# Patient Record
Sex: Female | Born: 1975 | Race: White | Hispanic: No | Smoking: Former smoker
Health system: Southern US, Community
[De-identification: ages and names within clinical notes are randomized; demographics above are authoritative.]

## PROBLEM LIST (undated history)

## (undated) DIAGNOSIS — IMO0002 Reserved for concepts with insufficient information to code with codable children: Secondary | ICD-10-CM

## (undated) DIAGNOSIS — F32A Depression, unspecified: Secondary | ICD-10-CM

## (undated) DIAGNOSIS — M1990 Unspecified osteoarthritis, unspecified site: Secondary | ICD-10-CM

## (undated) DIAGNOSIS — F419 Anxiety disorder, unspecified: Secondary | ICD-10-CM

## (undated) DIAGNOSIS — F319 Bipolar disorder, unspecified: Secondary | ICD-10-CM

## (undated) DIAGNOSIS — E785 Hyperlipidemia, unspecified: Secondary | ICD-10-CM

## (undated) DIAGNOSIS — J45909 Unspecified asthma, uncomplicated: Secondary | ICD-10-CM

## (undated) DIAGNOSIS — G43909 Migraine, unspecified, not intractable, without status migrainosus: Secondary | ICD-10-CM

## (undated) DIAGNOSIS — M797 Fibromyalgia: Secondary | ICD-10-CM

## (undated) DIAGNOSIS — G629 Polyneuropathy, unspecified: Secondary | ICD-10-CM

## (undated) DIAGNOSIS — K219 Gastro-esophageal reflux disease without esophagitis: Secondary | ICD-10-CM

## (undated) DIAGNOSIS — F329 Major depressive disorder, single episode, unspecified: Secondary | ICD-10-CM

## (undated) DIAGNOSIS — J302 Other seasonal allergic rhinitis: Secondary | ICD-10-CM

## (undated) HISTORY — PX: ABDOMINAL HYSTERECTOMY: SHX81

## (undated) HISTORY — PX: TUBAL LIGATION: SHX77

## (undated) HISTORY — PX: CARPAL TUNNEL RELEASE: SHX101

## (undated) HISTORY — PX: BACK SURGERY: SHX140

## (undated) HISTORY — PX: LEG SURGERY: SHX1003

## (undated) HISTORY — DX: Hyperlipidemia, unspecified: E78.5

## (undated) HISTORY — PX: OTHER SURGICAL HISTORY: SHX169

---
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
## 1997-09-04 HISTORY — PX: TUBAL LIGATION: SHX77

## 1998-08-25 ENCOUNTER — Emergency Department (HOSPITAL_COMMUNITY): Admission: EM | Admit: 1998-08-25 | Discharge: 1998-08-25 | Payer: Self-pay | Admitting: Emergency Medicine

## 1999-02-10 ENCOUNTER — Emergency Department (HOSPITAL_COMMUNITY): Admission: EM | Admit: 1999-02-10 | Discharge: 1999-02-10 | Payer: Self-pay

## 2001-07-10 ENCOUNTER — Emergency Department (HOSPITAL_COMMUNITY): Admission: EM | Admit: 2001-07-10 | Discharge: 2001-07-10 | Payer: Self-pay | Admitting: Emergency Medicine

## 2002-02-21 ENCOUNTER — Emergency Department (HOSPITAL_COMMUNITY): Admission: EM | Admit: 2002-02-21 | Discharge: 2002-02-21 | Payer: Self-pay | Admitting: Emergency Medicine

## 2002-06-03 ENCOUNTER — Emergency Department (HOSPITAL_COMMUNITY): Admission: EM | Admit: 2002-06-03 | Discharge: 2002-06-03 | Payer: Self-pay | Admitting: Emergency Medicine

## 2002-06-13 ENCOUNTER — Emergency Department (HOSPITAL_COMMUNITY): Admission: EM | Admit: 2002-06-13 | Discharge: 2002-06-13 | Payer: Self-pay | Admitting: Emergency Medicine

## 2002-06-24 ENCOUNTER — Emergency Department (HOSPITAL_COMMUNITY): Admission: EM | Admit: 2002-06-24 | Discharge: 2002-06-24 | Payer: Self-pay | Admitting: Emergency Medicine

## 2002-07-29 ENCOUNTER — Emergency Department (HOSPITAL_COMMUNITY): Admission: EM | Admit: 2002-07-29 | Discharge: 2002-07-30 | Payer: Self-pay | Admitting: Emergency Medicine

## 2003-01-19 ENCOUNTER — Emergency Department (HOSPITAL_COMMUNITY): Admission: EM | Admit: 2003-01-19 | Discharge: 2003-01-19 | Payer: Self-pay | Admitting: Emergency Medicine

## 2003-01-19 ENCOUNTER — Encounter: Payer: Self-pay | Admitting: Emergency Medicine

## 2003-04-01 ENCOUNTER — Emergency Department (HOSPITAL_COMMUNITY): Admission: EM | Admit: 2003-04-01 | Discharge: 2003-04-01 | Payer: Self-pay | Admitting: Emergency Medicine

## 2003-04-01 ENCOUNTER — Encounter: Payer: Self-pay | Admitting: Emergency Medicine

## 2005-12-12 ENCOUNTER — Emergency Department (HOSPITAL_COMMUNITY): Admission: EM | Admit: 2005-12-12 | Discharge: 2005-12-12 | Payer: Self-pay | Admitting: Emergency Medicine

## 2006-05-31 ENCOUNTER — Ambulatory Visit (HOSPITAL_COMMUNITY): Admission: RE | Admit: 2006-05-31 | Discharge: 2006-05-31 | Payer: Self-pay | Admitting: Obstetrics and Gynecology

## 2007-09-05 HISTORY — PX: BACK SURGERY: SHX140

## 2007-10-18 ENCOUNTER — Encounter: Admission: RE | Admit: 2007-10-18 | Discharge: 2007-10-18 | Payer: Self-pay | Admitting: Orthopedic Surgery

## 2007-10-26 ENCOUNTER — Emergency Department (HOSPITAL_COMMUNITY): Admission: EM | Admit: 2007-10-26 | Discharge: 2007-10-26 | Payer: Self-pay | Admitting: Emergency Medicine

## 2007-11-27 ENCOUNTER — Ambulatory Visit: Payer: Self-pay | Admitting: *Deleted

## 2007-11-27 ENCOUNTER — Inpatient Hospital Stay (HOSPITAL_COMMUNITY): Admission: RE | Admit: 2007-11-27 | Discharge: 2007-11-30 | Payer: Self-pay | Admitting: Orthopedic Surgery

## 2007-11-28 ENCOUNTER — Encounter (INDEPENDENT_AMBULATORY_CARE_PROVIDER_SITE_OTHER): Payer: Self-pay | Admitting: Orthopedic Surgery

## 2008-01-21 ENCOUNTER — Encounter: Admission: RE | Admit: 2008-01-21 | Discharge: 2008-03-09 | Payer: Self-pay | Admitting: Orthopedic Surgery

## 2008-06-16 ENCOUNTER — Emergency Department (HOSPITAL_COMMUNITY): Admission: EM | Admit: 2008-06-16 | Discharge: 2008-06-16 | Payer: Self-pay | Admitting: Emergency Medicine

## 2008-09-04 HISTORY — PX: ABDOMINAL HYSTERECTOMY: SHX81

## 2009-02-24 ENCOUNTER — Emergency Department (HOSPITAL_COMMUNITY): Admission: EM | Admit: 2009-02-24 | Discharge: 2009-02-24 | Payer: Self-pay | Admitting: Emergency Medicine

## 2009-07-22 ENCOUNTER — Ambulatory Visit: Payer: Self-pay | Admitting: Radiology

## 2009-07-22 ENCOUNTER — Emergency Department (HOSPITAL_BASED_OUTPATIENT_CLINIC_OR_DEPARTMENT_OTHER): Admission: EM | Admit: 2009-07-22 | Discharge: 2009-07-22 | Payer: Self-pay | Admitting: Emergency Medicine

## 2009-07-25 ENCOUNTER — Emergency Department (HOSPITAL_COMMUNITY): Admission: EM | Admit: 2009-07-25 | Discharge: 2009-07-25 | Payer: Self-pay | Admitting: Emergency Medicine

## 2009-08-09 ENCOUNTER — Encounter
Admission: RE | Admit: 2009-08-09 | Discharge: 2009-08-09 | Payer: Self-pay | Admitting: Physical Medicine and Rehabilitation

## 2009-09-04 HISTORY — PX: PLANTAR FASCIA SURGERY: SHX746

## 2009-09-04 HISTORY — PX: BLADDER SURGERY: SHX569

## 2010-04-03 ENCOUNTER — Emergency Department (HOSPITAL_BASED_OUTPATIENT_CLINIC_OR_DEPARTMENT_OTHER): Admission: EM | Admit: 2010-04-03 | Discharge: 2010-04-03 | Payer: Self-pay | Admitting: Emergency Medicine

## 2010-05-27 ENCOUNTER — Emergency Department (HOSPITAL_COMMUNITY): Admission: EM | Admit: 2010-05-27 | Discharge: 2010-05-27 | Payer: Self-pay | Admitting: Emergency Medicine

## 2010-08-03 ENCOUNTER — Emergency Department (HOSPITAL_COMMUNITY): Admission: EM | Admit: 2010-08-03 | Discharge: 2010-08-03 | Payer: Self-pay | Admitting: Emergency Medicine

## 2010-11-17 LAB — GLUCOSE, CAPILLARY: Glucose-Capillary: 99 mg/dL (ref 70–99)

## 2010-12-07 LAB — BASIC METABOLIC PANEL
CO2: 30 mEq/L (ref 19–32)
Chloride: 102 mEq/L (ref 96–112)
Creatinine, Ser: 0.7 mg/dL (ref 0.4–1.2)
GFR calc Af Amer: >60 mL/min (ref >60)
Potassium: 5.1 mEq/L (ref 3.5–5.1)
Sodium: 144 mEq/L (ref 135–145)

## 2010-12-07 LAB — CBC
Hemoglobin: 14.6 g/dL (ref 12.0–15.0)
MCV: 93.5 fL (ref 78.0–100.0)
RBC: 4.57 MIL/uL (ref 3.87–5.11)
WBC: 9 10*3/uL (ref 4.0–10.5)

## 2010-12-07 LAB — DIFFERENTIAL
Eosinophils Absolute: 0.2 10*3/uL (ref 0.0–0.7)
Lymphs Abs: 3.6 10*3/uL (ref 0.7–4.0)
Monocytes Absolute: 0.5 10*3/uL (ref 0.1–1.0)
Monocytes Relative: 6 % (ref 3–12)
Neutrophils Relative %: 51 % (ref 43–77)

## 2010-12-07 LAB — POCT CARDIAC MARKERS
Myoglobin, poc: 30.2 ng/mL (ref 12–200)
Troponin i, poc: <0.05 ng/mL (ref 0.00–0.09)

## 2011-01-03 ENCOUNTER — Emergency Department (HOSPITAL_COMMUNITY)
Admission: EM | Admit: 2011-01-03 | Discharge: 2011-01-04 | Disposition: A | Discharge status: Home / Self Care | Payer: Medicaid Other | Attending: Emergency Medicine | Admitting: Emergency Medicine

## 2011-01-03 ENCOUNTER — Emergency Department (HOSPITAL_COMMUNITY): Payer: Medicaid Other

## 2011-01-03 DIAGNOSIS — Z90710 Acquired absence of both cervix and uterus: Secondary | ICD-10-CM | POA: Insufficient documentation

## 2011-01-03 DIAGNOSIS — R1031 Right lower quadrant pain: Secondary | ICD-10-CM | POA: Insufficient documentation

## 2011-01-03 DIAGNOSIS — R079 Chest pain, unspecified: Secondary | ICD-10-CM | POA: Insufficient documentation

## 2011-01-03 DIAGNOSIS — R609 Edema, unspecified: Secondary | ICD-10-CM | POA: Insufficient documentation

## 2011-01-03 DIAGNOSIS — F17200 Nicotine dependence, unspecified, uncomplicated: Secondary | ICD-10-CM | POA: Insufficient documentation

## 2011-01-03 LAB — COMPREHENSIVE METABOLIC PANEL
AST: 13 U/L (ref 0–37)
Albumin: 3.6 g/dL (ref 3.5–5.2)
BUN: 8 mg/dL (ref 6–23)
Calcium: 9.9 mg/dL (ref 8.4–10.5)
Chloride: 98 mEq/L (ref 96–112)
Creatinine, Ser: 0.86 mg/dL (ref 0.4–1.2)
GFR calc Af Amer: >60 mL/min (ref >60)
Total Bilirubin: 0.1 mg/dL — ABNORMAL LOW (ref 0.3–1.2)

## 2011-01-03 LAB — URINALYSIS, ROUTINE W REFLEX MICROSCOPIC
Ketones, ur: NEGATIVE mg/dL
Nitrite: NEGATIVE
Protein, ur: NEGATIVE mg/dL

## 2011-01-03 LAB — DIFFERENTIAL
Basophils Absolute: 0 K/uL (ref 0.0–0.1)
Basophils Relative: 0 % (ref 0–1)
Eosinophils Absolute: 0.3 K/uL (ref 0.0–0.7)
Eosinophils Relative: 2 % (ref 0–5)
Lymphocytes Relative: 36 % (ref 12–46)
Lymphs Abs: 4.6 K/uL — ABNORMAL HIGH (ref 0.7–4.0)
Monocytes Absolute: 0.6 K/uL (ref 0.1–1.0)
Monocytes Relative: 5 % (ref 3–12)
Neutro Abs: 7.2 K/uL (ref 1.7–7.7)
Neutrophils Relative %: 56 % (ref 43–77)

## 2011-01-03 LAB — CBC
MCH: 31.5 pg (ref 26.0–34.0)
MCHC: 34.2 g/dL (ref 30.0–36.0)
MCV: 92 fL (ref 78.0–100.0)
Platelets: 329 10*3/uL (ref 150–400)
RDW: 12.7 % (ref 11.5–15.5)

## 2011-01-04 ENCOUNTER — Emergency Department (HOSPITAL_COMMUNITY): Payer: Medicaid Other

## 2011-01-04 ENCOUNTER — Encounter (HOSPITAL_COMMUNITY): Payer: Self-pay

## 2011-01-04 MED ORDER — IOHEXOL 300 MG/ML  SOLN
100.0000 mL | Freq: Once | INTRAMUSCULAR | Status: AC | PRN
  Administered 2011-01-04: 100 mL via INTRAVENOUS

## 2011-01-17 NOTE — Discharge Summary (Signed)
[name], [name]            ACCOUNT NO.:  [id number]   MEDICAL RECORD NO.:  [id number]          PATIENT TYPE:  INP   LOCATION:  5007                         FACILITY:  [facility]   PHYSICIAN:  Alvy Beal, MD    DATE OF BIRTH:  07-22-1976   DATE OF ADMISSION:  11/27/2007  DATE OF DISCHARGE:  11/30/2007                               DISCHARGE SUMMARY   ADMISSION DIAGNOSIS:  Lumbar degenerative disk disease at the L5-S1  level.   DISCHARGE DIAGNOSES:  Lumbar degenerative disk disease at L5-S1 level as  well as tobacco use disorder.   PROCEDURE:  Anterior lumbar interbody fusion at the L5-S1 level.   BRIEF HISTORY:  Ms. [name] is a very pleasant 34 year old young lady  who is well known to our office.  She has been having a horrific low  back pain for several years now.  She has been treated by chiropractors  as well as our partner, with Dr. Thad Ranger for multiple epidural steroid  injections, and she has also tried multiple courses of physical therapy  including land-based and aquatic therapy as well as pain medical  management.  Despite all of these modalities, her back pain has only  progressed.  Therefore, the patient came to Dr. Shon Baton and myself for  discussion of a surgical intervention.  Options that we discussed with  her included an anterior interbody fusion.  The patient was instructed  on the risks and benefits of the procedure, understood all of these, and  agreed to the procedure.   HOSPITAL COURSE:  The patient's hospital course was 3 days in length  after surgery.  On postoperative day #1, the patient was ambulating with  assistance.  A lower leg Doppler demonstrated no signs of DVT or that of  possible ischemia.  Also, postoperative day #1, post CT scan  demonstrated a good placement of the hardware with no complications.  By  postoperative day #3, the patient was able to ambulate on her own, was  tolerating a regular diet, was able to void on her own and  have bowel  movements on her own.  Throughout her hospital course, her calves  remained soft and nontender.  Her incision was clean, dry, and intact.  Neurovascularly, she remained intact with no complications.  The patient  was therefore discharged to home in a stable condition.   ADMISSION LABS:  WBC of 10.2, RBC of 4.46, hemoglobin of 14.3, and  hematocrit of 41.4.   DISCHARGE LABS:  WBC of 13.2, RBC of 3.57, a hemoglobin of 11.4, and  hematocrit of 33.4.  Again on labs and pertinent laboratory and  diagnostic testing, please see the above note.  Again, the lower  extremity Doppler was negative for any signs of DVT and the CT scan  demonstrated a good placement of the hardware.   DISCHARGE MEDICATIONS:  1. Percocet.  2. Lyrica.  3. Robaxin.   DISCHARGE INSTRUCTIONS:  The patient was instructed to ambulate with a  walker.  She is to remain in her back brace while she is ambulating.  She is to follow up in our office with  Dr. Shon Baton in 2 weeks for a wound  check and suture removal.  She can have a regular diet, and again when  we see her in approximately 2 weeks in our office, we will do a wound  check.  All questions were encouraged and answered.      Crissie Reese, PA      Alvy Beal, MD  Electronically Signed    AC/MEDQ  D:  01/01/2008  T:  01/02/2008  Job:  [id number]

## 2011-01-17 NOTE — Op Note (Signed)
[name], [name]            ACCOUNT NO.:  [id number]   MEDICAL RECORD NO.:  [id number]          PATIENT TYPE:  INP   LOCATION:  5007                         FACILITY:  [facility]   PHYSICIAN:  Balinda Quails, M.D.    DATE OF BIRTH:  1976/05/16   DATE OF PROCEDURE:  11/27/2007  DATE OF DISCHARGE:                               OPERATIVE REPORT   SURGEON:  P Bud Face, MD.   Tina GriffithsVenita Lick, MD.   ANESTHETIC:  General endotracheal.   ANESTHESIOLOGIST:  Dr. Jacklynn Bue.   PREOPERATIVE DIAGNOSIS:  L5-S1 degenerative disk disease.   POSTOPERATIVE DIAGNOSIS:  L5-S1 degenerative disk disease.   PROCEDURE:  L5-S1 anterior lumbar interbody fusion (ALIF).   CLINICAL NOTE:  [name] is a 35 year old female with chronic  back pain and severe degenerative L5-S1 disk disease.  She is scheduled  today to undergo L5-S1 ALIF by Dr. Shon Baton.  She was seen preoperatively  in the holding area.  No history of DVT or pulmonary embolus.  No  history of vascular disease.   She does have obesity.  The details of the operative procedure were  explained.  Potential risks were reviewed including but not limited to  DVT, pulmonary embolus, bleeding, transfusion, vessel occlusion with  limb ischemia, ureter injury, hernia, deep infection, or other major  complication.  The patient consented for surgery.   OPERATIVE PROCEDURE:  The patient was brought to the operating room  stable hemodynamic condition.  Placed under general endotracheal  anesthesia.  Foley catheter, arterial line, central venous catheter in  place.   The abdomen prepped and draped in a sterile fashion.  Left lower  quadrant transverse skin incision made just superior to the symphysis  pubis.  Dissection carried down through subcutaneous tissue.  The left  anterior rectus sheath cleared.  The left anterior rectus sheath incised  from midline to lateral margin of left rectus muscle.  Rectus muscle  mobilized medially.   The left retroperitoneal space entered.  The  peritoneal contents rotated anteriorly.  The arcuate line was incised  longitudinally.  Peritoneal cavity was not entered.   The left common iliac artery and vein identified.  The L5-S1 disk was  palpated.  This was severely degenerated with extensive osteophytes.   Using a combination of Bovie cautery, bipolar cautery and blunt  dissection, the L5-S1 disk was exposed from left-to-right.  The middle  sacral vessels were controlled with bipolar cautery and divided.  The  disk fully exposed.   Using a [employer] retractor, the disk was fully exposed.  Reverse  lip blades were placed on the lateral margins of L5-S1 bilaterally.  Malleable retractors placed superiorly and inferiorly.  The disk was  completely exposed.  The disk verified with a spinal needle and  fluoroscopy.   Dr. Shon Baton then completed the ALIF.  Closure dictated separately by Dr.  Shon Baton.  There were no apparent complications during the exposure aspect  of this procedure.      Balinda Quails, M.D.  Electronically Signed     PGH/MEDQ  D:  11/27/2007  T:  11/28/2007  Job:  [id number] 

## 2011-01-17 NOTE — Op Note (Signed)
[name], [name]            ACCOUNT NO.:  [id number]   MEDICAL RECORD NO.:  [id number]          PATIENT TYPE:  INP   LOCATION:  5007                         FACILITY:  [facility]   PHYSICIAN:  Alvy Beal, MD    DATE OF BIRTH:  15-Jan-1976   DATE OF PROCEDURE:  11/27/2007  DATE OF DISCHARGE:                               OPERATIVE REPORT   PREOPERATIVE DIAGNOSIS:  Degenerative disk disease, L5-S1 with severe  diskogenic back pain.   POSTOPERATIVE DIAGNOSIS:  Degenerative disk disease, L5-S1 with severe  diskogenic back pain.   OPERATIVE PROCEDURE:  Anterior lumbar interbody fusion and  instrumentation, L5-S1.  System used was a 12 degrees, size 12 mm high,  small Synthes Synfix cage with 20 mm screws placed into the body of L5  and 25 mm screws placed into the body of S1.   COMPLICATIONS:  None.   INTRAOPERATIVE FINDINGS:  Significant anterior osteophyte.  There is a  fractured osteophyte off the left anterior S1 end plate the majority of  which was resected.  No other intraoperative complications.  Anterior  approach done by Dr. Madilyn Fireman.   FIRST ASSISTANT:  Crissie Reese, PA.   HISTORY:  The patient is a pleasant 35 year old woman, who had been  complaining of severe low back pain and buttock pain.  The patient had  failed conservative management consisting of physiotherapy, injection  therapy and manipulations.  After failed attempts at conservative  management a preoperative MRI and diskogram confirmed that 5-1 was the  symptomatic level.  After discussing treatment options, she elected to  proceed with surgery.  All appropriate risks, benefits and alternatives  were discussed with the patient and consent was obtained.   OPERATIVE NOTE:  The patient is brought to the operating room, placed  supine on the operating table.  After successful induction of general  anesthesia and endotracheal intubation TEDs, SCDs and Foley were  applied.  Anterior abdomen was prepped  and draped in standard fashion.   At this point in time, Dr. Madilyn Fireman performed a standard anterior approach  to the lumbar spine.  Please refer to his dictation for specifics on the  approach.   Once we had the approach completed, a needle was placed into the L5-S1  disk space and we confirmed that where at the appropriate level.   Once we had confirmed that we were at the appropriate level, I then  incised the disk space.  Using a combination of pituitary rongeurs,  curettes and Kerrison rongeurs, I resected the majority of the disk  space.  At this point I noted there was a significant anterior  osteophyte on the superior left lateral side of S1.  I then used an  osteotome to resect this.  I had noted at this point once I had resected  it, that it was actually fractured off the superior endplate of S1.  As  a result I then debrided most of it to use later on as the bone graft.   At this point I resected all the disk material down to the posterior  annulus.  I used a small fine  curved curette to resect the posterior  annulus from the posterior aspect of the S1 vertebral body and L5  vertebral body.  At this point I had parallel distraction in the neural  foramen posteriorly.  The volume was increased.  At this point with an  adequate diskectomy performed I then used a high-speed bur to obtain  bleeding bone in the endplate.  Once I had bleeding bone, I then  irrigated the disk space removing any loose material.  I used a 3 mm  long handled Kerrison to resect some the posterior annulus and  osteophytes.  At this point, I then placed a trial spacer and I noted  that the small trial spacer allowed me to properly position and slightly  off to the right-hand side so that the screws screws were now obtaining  purchase and then anterior superior lateral osteophyte resected.  As  such, I obtained the appropriate size 12 mm high, 12 lordotic cage  packed with Actifuse and placed into the  appropriate depth.  At this  point with the plate properly positioned, I irrigated the wound  copiously with normal saline and then affixed it to the bodies of L5 and  S1 with locking screws.  At this point final x-rays demonstrated  satisfactory position in the AP and lateral planes.  I then removed all  the retractors, irrigated and checked for any bleeding.  I then closed  the rectus fascia with running #1 Vicryl sutures, Scarpa's fascia with  interrupted 2-0 Vicryl sutures, deep subcutaneous tissue with  interrupted 2-0 Vicryl sutures, and 3-0 Monocryl for the skin.  Steri-  Strips, dry dressing were applied and intraoperative abdominal film  confirmed that there was no sponges or other unwanted surgical material  within the wound just the appropriate hardware at L5-S1.  The patient  was extubated, transferred to PACU without incident.  At the end of the  case all the sponge counts were correct.      Alvy Beal, MD  Electronically Signed     DDB/MEDQ  D:  11/27/2007  T:  11/28/2007  Job:  [id number]   cc:   Balinda Quails, M.D.

## 2011-05-29 LAB — CBC
Hemoglobin: 11.4 — ABNORMAL LOW
Hemoglobin: 14.3
MCHC: 34.2
MCHC: 34.5
MCV: 92.8
RBC: 4.46
RDW: 12.6
WBC: 10.2

## 2011-05-29 LAB — TYPE AND SCREEN
ABO/RH(D): A POS
Antibody Screen: POSITIVE
DAT, IgG: NEGATIVE
PT AG Type: NEGATIVE

## 2011-06-05 LAB — URINALYSIS, ROUTINE W REFLEX MICROSCOPIC
Nitrite: NEGATIVE
Protein, ur: NEGATIVE
Specific Gravity, Urine: 1.024
Urobilinogen, UA: 1

## 2011-06-05 LAB — DIFFERENTIAL
Basophils Absolute: 0.1
Basophils Relative: 1
Eosinophils Absolute: 0.1
Monocytes Relative: 5
Neutro Abs: 4.8
Neutrophils Relative %: 55

## 2011-06-05 LAB — POCT I-STAT, CHEM 8
Creatinine, Ser: 0.8
HCT: 45
Hemoglobin: 15.3 — ABNORMAL HIGH
Sodium: 139
TCO2: 29

## 2011-06-05 LAB — URINE MICROSCOPIC-ADD ON

## 2011-06-05 LAB — D-DIMER, QUANTITATIVE: D-Dimer, Quant: 0.37

## 2011-06-05 LAB — POCT CARDIAC MARKERS: CKMB, poc: <1 — ABNORMAL LOW

## 2011-06-05 LAB — CBC
MCHC: 34.6
MCV: 91.8
Platelets: 320
RDW: 13.3

## 2012-09-04 HISTORY — PX: CARPAL TUNNEL RELEASE: SHX101

## 2012-11-01 ENCOUNTER — Encounter (HOSPITAL_COMMUNITY): Payer: Self-pay | Admitting: *Deleted

## 2012-11-01 ENCOUNTER — Emergency Department (HOSPITAL_COMMUNITY)
Admission: EM | Admit: 2012-11-01 | Discharge: 2012-11-01 | Disposition: A | Discharge status: Home / Self Care | Payer: Medicaid Other | Attending: Emergency Medicine | Admitting: Emergency Medicine

## 2012-11-01 DIAGNOSIS — Z9889 Other specified postprocedural states: Secondary | ICD-10-CM | POA: Insufficient documentation

## 2012-11-01 DIAGNOSIS — N390 Urinary tract infection, site not specified: Secondary | ICD-10-CM | POA: Insufficient documentation

## 2012-11-01 DIAGNOSIS — Z79899 Other long term (current) drug therapy: Secondary | ICD-10-CM | POA: Insufficient documentation

## 2012-11-01 DIAGNOSIS — F17200 Nicotine dependence, unspecified, uncomplicated: Secondary | ICD-10-CM | POA: Insufficient documentation

## 2012-11-01 DIAGNOSIS — R350 Frequency of micturition: Secondary | ICD-10-CM | POA: Insufficient documentation

## 2012-11-01 LAB — URINALYSIS, ROUTINE W REFLEX MICROSCOPIC
Bilirubin Urine: NEGATIVE
Hgb urine dipstick: NEGATIVE
Ketones, ur: NEGATIVE mg/dL
Specific Gravity, Urine: 1.015 (ref 1.005–1.030)
Urobilinogen, UA: 0.2 mg/dL (ref 0.0–1.0)

## 2012-11-01 LAB — URINE MICROSCOPIC-ADD ON

## 2012-11-01 MED ORDER — CIPROFLOXACIN HCL 500 MG PO TABS: 500.0000 mg | ORAL_TABLET | Freq: Two times a day (BID) | ORAL | Status: DC

## 2012-11-01 MED ORDER — HYDROMORPHONE HCL PF 1 MG/ML IJ SOLN
1.0000 mg | Freq: Once | INTRAMUSCULAR | Status: AC
  Administered 2012-11-01: 1 mg via INTRAMUSCULAR
  Filled 2012-11-01: qty 1

## 2012-11-01 NOTE — ED Notes (Signed)
Pt c/o lower back pain; increased left lower back pain; feels like possible kidney infection; increased urination; pain with urination; dull sharp pain

## 2012-11-01 NOTE — ED Provider Notes (Signed)
History     CSN: [id number]  Arrival date & time 11/01/12  0700   First MD Initiated Contact with Patient 11/01/12 [phone]      Chief Complaint  Patient presents with  . Back Pain    (Consider location/radiation/quality/duration/timing/severity/associated sxs/prior treatment) Patient is a 37 y.o. female presenting with back pain. The history is provided by the patient (pt complains of some back pain and urinary frequency). No language interpreter was used.  Back Pain Location:  Lumbar spine Quality:  Aching Radiates to:  Does not radiate Pain severity:  Moderate Pain is:  Same all the time Onset quality:  Gradual Timing:  Constant Progression:  Unchanged Chronicity:  New Associated symptoms: no abdominal pain, no chest pain and no headaches     History reviewed. No pertinent past medical history.  Past Surgical History  Procedure Laterality Date  . Abdominal hysterectomy    . Tubal ligation    . Back surgery    . Leg surgery    . Carpal tunnel release      No family history on file.  History  Substance Use Topics  . Smoking status: Current Every Day Smoker -- 0.50 packs/day    Types: Cigarettes  . Smokeless tobacco: Not on file  . Alcohol Use: No    OB History   Grav Para Term Preterm Abortions TAB SAB Ect Mult Living                  Review of Systems  Constitutional: Negative for fatigue.  HENT: Negative for congestion, sinus pressure and ear discharge.   Eyes: Negative for discharge.  Respiratory: Negative for cough.   Cardiovascular: Negative for chest pain.  Gastrointestinal: Negative for abdominal pain and diarrhea.  Genitourinary: Positive for frequency. Negative for hematuria.  Musculoskeletal: Positive for back pain.  Skin: Negative for rash.  Neurological: Negative for seizures and headaches.  Psychiatric/Behavioral: Negative for hallucinations.    Allergies  Bee pollen; Amoxicillin; Aspirin; Erythromycin; Food; Penicillins; and  Prozac  Home Medications   Current Outpatient Rx  Name  Route  Sig  Dispense  Refill  . Biotin 2500 MCG CAPS   Oral   Take 2,500 mcg by mouth daily.         . chlorzoxazone (PARAFON) 500 MG tablet   Oral   Take 500 mg by mouth 4 (four) times daily as needed for muscle spasms.         . cholecalciferol (VITAMIN D) 1000 UNITS tablet   Oral   Take 1,000 Units by mouth daily.         Marland Kitchen EPINEPHrine (EPI-PEN) 0.3 mg/0.3 mL DEVI   Intramuscular   Inject 0.3 mg into the muscle once.         Marland Kitchen esomeprazole (NEXIUM) 40 MG capsule   Oral   Take 40 mg by mouth daily before breakfast.         . nortriptyline (PAMELOR) 25 MG capsule   Oral   Take 25 mg by mouth 2 (two) times daily.         Marland Kitchen oxyCODONE-acetaminophen (PERCOCET) 10-325 MG per tablet   Oral   Take 1 tablet by mouth every 4 (four) hours as needed for pain.         . pregabalin (LYRICA) 75 MG capsule   Oral   Take 75 mg by mouth 3 (three) times daily.         . solifenacin (VESICARE) 10 MG tablet   Oral  Take 10 mg by mouth daily.         Marland Kitchen topiramate (TOPAMAX) 25 MG tablet   Oral   Take 25 mg by mouth 2 (two) times daily.         . vitamin B-12 (CYANOCOBALAMIN) 1000 MCG tablet   Oral   Take 1,000 mcg by mouth daily.         . vitamin C (ASCORBIC ACID) 500 MG tablet   Oral   Take 500 mg by mouth daily.         . ciprofloxacin (CIPRO) 500 MG tablet   Oral   Take 1 tablet (500 mg total) by mouth 2 (two) times daily.   14 tablet   0     BP 128/79  Pulse 78  Temp(Src) 98 F (36.7 C)  Resp 20  SpO2 100%  Physical Exam  Constitutional: She is oriented to person, place, and time. She appears well-developed.  HENT:  Head: Normocephalic and atraumatic.  Eyes: Conjunctivae and EOM are normal. No scleral icterus.  Neck: Neck supple. No thyromegaly present.  Cardiovascular: Normal rate and regular rhythm.  Exam reveals no gallop and no friction rub.   No murmur  heard. Pulmonary/Chest: No stridor. She has no wheezes. She has no rales. She exhibits no tenderness.  Abdominal: She exhibits no distension. There is no tenderness. There is no rebound.  Musculoskeletal: Normal range of motion. She exhibits no edema.  Mild tenderness left and right lumbar area  Lymphadenopathy:    She has no cervical adenopathy.  Neurological: She is oriented to person, place, and time. Coordination normal.  Skin: No rash noted. No erythema.  Psychiatric: She has a normal mood and affect. Her behavior is normal.    ED Course  Procedures (including critical care time)  Labs Reviewed  URINALYSIS, ROUTINE W REFLEX MICROSCOPIC - Abnormal; Notable for the following:    APPearance CLOUDY (*)    Leukocytes, UA SMALL (*)    All other components within normal limits  URINE MICROSCOPIC-ADD ON - Abnormal; Notable for the following:    Squamous Epithelial / LPF FEW (*)    Bacteria, UA FEW (*)    All other components within normal limits  URINE CULTURE   No results found.   1. UTI (lower urinary tract infection)       MDM          Benny Lennert, MD 11/01/12 [phone]

## 2012-11-02 LAB — URINE CULTURE

## 2013-04-14 ENCOUNTER — Encounter: Payer: Self-pay | Admitting: Neurology

## 2013-04-14 ENCOUNTER — Ambulatory Visit (INDEPENDENT_AMBULATORY_CARE_PROVIDER_SITE_OTHER): Payer: Medicaid Other | Admitting: Neurology

## 2013-04-14 VITALS — BP 109/74 | HR 81 | Temp 97.4°F | Ht 65.0 in | Wt 239.0 lb

## 2013-04-14 DIAGNOSIS — G8929 Other chronic pain: Secondary | ICD-10-CM | POA: Insufficient documentation

## 2013-04-14 DIAGNOSIS — R51 Headache: Secondary | ICD-10-CM

## 2013-04-14 DIAGNOSIS — R209 Unspecified disturbances of skin sensation: Secondary | ICD-10-CM

## 2013-04-14 DIAGNOSIS — M549 Dorsalgia, unspecified: Secondary | ICD-10-CM

## 2013-04-14 DIAGNOSIS — R519 Headache, unspecified: Secondary | ICD-10-CM | POA: Insufficient documentation

## 2013-04-14 DIAGNOSIS — R202 Paresthesia of skin: Secondary | ICD-10-CM

## 2013-04-14 NOTE — Patient Instructions (Addendum)
She was advised to discontinue ibuprofen do too and I disagree brown. Increase Topamax to 100 mg in the morning and 50 mg at night for 2 weeks and if needed increase further to 100 mg twice daily. I advised her to do neck stretching exercises. Continue Lyrica 75 mg at night. Check MRI scan of the brain with and without for her headaches. Return for followup in 3 months with Su Ley, NP

## 2013-04-14 NOTE — Progress Notes (Signed)
[facility] [address] [place] [place] [address] [phone]       OFFICE FOLLOW-UP NOTE  Ms. 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 Chronic low back pain from degenerative lumbar spine disease 04/14/2013 she is seen today for followup of the last visit on 07/03/12. She states that Topamax seems to be helping her feet paresthesias. Her pain is not as frequent or severe. She is in fact managed to reduce the Lyrica   to 75 mg at bedtime only. She continued to have chronic low back pain and had back injection by Dr. Ethelene Hal last week. She has a new complaint of daily headaches for the last 3 months. She has a long-standing history of migraine headaches but has been having more frequent headaches. She admits to taking 4-6 tablets of Advil every day for headaches which provided him with short-term relief. She says that she has at least 2-3 severe migraine headaches a week. She describes nausea light and sound sensitivity with her severe headaches but not for those other daily headaches. She is unable to the prior specific triggers for headaches. She admits to neck pain and muscle tightness. She has never been a migraine prophylaxis in the past. She seems without Topamax 50 mg twice daily without any side effects ROS:   14 system review of systems is positive for snoring, constipation, itching, feeling hot, joint pain, aching muscles, allergies, headache, numbness, dizziness, depression, not enough sleep.  PMH: No past medical history on file.  Social History:  History   Social History  . Marital Status: Married    Spouse Name: N/A    Number of Children: N/A  . Years of Education: N/A   Occupational History  . Not on file.   Social History Main Topics  . Smoking status: Current Every Day Smoker -- 0.50  packs/day    Types: Cigarettes  . Smokeless tobacco: Not on file  . Alcohol Use: No  . Drug Use: Not on file  . Sexually Active: Not on file   Other Topics Concern  . Not on file   Social History Narrative  . No narrative on file    Medications:   Current Outpatient Prescriptions on File Prior to Visit  Medication Sig Dispense Refill  . chlorzoxazone (PARAFON) 500 MG tablet Take 500 mg by mouth 4 (four) times daily as needed for muscle spasms.      . cholecalciferol (VITAMIN D) 1000 UNITS tablet Take 1,000 Units by mouth daily.      Marland Kitchen EPINEPHrine (EPI-PEN) 0.3 mg/0.3 mL DEVI Inject 0.3 mg into the muscle once.      Marland Kitchen esomeprazole (NEXIUM) 40 MG capsule Take 40 mg by mouth daily before breakfast.      . nortriptyline (PAMELOR) 25 MG capsule Take 25 mg by mouth 2 (two) times daily.      Marland Kitchen oxyCODONE-acetaminophen (PERCOCET) 10-325 MG per tablet Take 1 tablet by mouth every 4 (four) hours as needed for pain.      . pregabalin (LYRICA) 75 MG capsule Take 75 mg by mouth daily.       Marland Kitchen topiramate (TOPAMAX) 25 MG tablet Take 25 mg by mouth 2 (two) times daily.      . Biotin 2500 MCG CAPS Take 2,500 mcg by mouth daily.      . solifenacin (VESICARE) 10 MG  tablet Take 10 mg by mouth daily.      . vitamin B-12 (CYANOCOBALAMIN) 1000 MCG tablet Take 1,000 mcg by mouth daily.      . vitamin C (ASCORBIC ACID) 500 MG tablet Take 500 mg by mouth daily.       No current facility-administered medications on file prior to visit.    Allergies:   Allergies  Allergen Reactions  . Bee Pollen Anaphylaxis  . Amoxicillin Hives  . Aspirin Hives  . Erythromycin Hives  . Food     Raw tomatoes. Hives,rash,throat closing.   Marland Kitchen Penicillins Hives  . Prozac (Fluoxetine Hcl) Hives    Physical Exam General: well developed, well nourished, seated, in no evident distress Head: head normocephalic and atraumatic. Orohparynx benign Neck: supple with no carotid or supraclavicular bruits Cardiovascular: regular  rate and rhythm, no murmurs Musculoskeletal: no deformity Skin:  no rash/petichiae Vascular:  Normal pulses all extremities Filed Vitals:   04/14/13 1446  BP: 109/74  Pulse: 81  Temp: 97.4 F (36.3 C)    Neurologic Exam Mental Status: Awake and fully alert. Oriented to place and time. Recent and remote memory intact. Attention span, concentration and fund of knowledge appropriate. Mood and affect appropriate.  Cranial Nerves: Fundoscopic exam reveals sharp disc margins. Pupils equal, briskly reactive to light. Extraocular movements full without nystagmus. Visual fields full to confrontation. Hearing intact. Facial sensation intact. Face, tongue, palate moves normally and symmetrically.  Motor: Normal bulk and tone. Normal strength in all tested extremity muscles. Sensory.: intact to tough and pinprick and vibratory.  Coordination: Rapid alternating movements normal in all extremities. Finger-to-nose and heel-to-shin performed accurately bilaterally. Gait and Station: Arises from chair without difficulty. Stance is normal. Gait demonstrates normal stride length and balance but favors her knee due to pain. . Not able to heel, toe and tandem walk without difficulty.  Reflexes: 1+ and symmetric. Toes downgoing.       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 New-onset chronic daily headaches which likely represent transformed chronic migraine headaches with muscle tension headaches as well as annalgesic rebound.    PLAN: She was advised to discontinue ibuprofen decrease rebound headaches. Limit coffee intake to less than 2 cups per day..Increase Topamax to 100 mg in the morning and 50 mg at night for 2 weeks and if needed increase further to 100 mg twice daily. I advised her to do neck stretching exercises. Continue Lyrica 75 mg at night. Check  MRI scan of the brain with and without for her headaches. Return for followup in 3 months with Su Ley, NP

## 2013-06-06 ENCOUNTER — Emergency Department (HOSPITAL_COMMUNITY)
Admission: EM | Admit: 2013-06-06 | Discharge: 2013-06-06 | Disposition: A | Discharge status: Home / Self Care | Payer: Medicaid Other | Attending: Emergency Medicine | Admitting: Emergency Medicine

## 2013-06-06 ENCOUNTER — Encounter (HOSPITAL_COMMUNITY): Payer: Self-pay | Admitting: *Deleted

## 2013-06-06 ENCOUNTER — Telehealth: Payer: Self-pay | Admitting: Neurology

## 2013-06-06 DIAGNOSIS — Z8739 Personal history of other diseases of the musculoskeletal system and connective tissue: Secondary | ICD-10-CM | POA: Insufficient documentation

## 2013-06-06 DIAGNOSIS — Z880 Allergy status to penicillin: Secondary | ICD-10-CM | POA: Insufficient documentation

## 2013-06-06 DIAGNOSIS — G43909 Migraine, unspecified, not intractable, without status migrainosus: Secondary | ICD-10-CM

## 2013-06-06 DIAGNOSIS — Z79899 Other long term (current) drug therapy: Secondary | ICD-10-CM | POA: Insufficient documentation

## 2013-06-06 DIAGNOSIS — Z87891 Personal history of nicotine dependence: Secondary | ICD-10-CM | POA: Insufficient documentation

## 2013-06-06 DIAGNOSIS — R110 Nausea: Secondary | ICD-10-CM | POA: Insufficient documentation

## 2013-06-06 DIAGNOSIS — Z8659 Personal history of other mental and behavioral disorders: Secondary | ICD-10-CM | POA: Insufficient documentation

## 2013-06-06 HISTORY — DX: Reserved for concepts with insufficient information to code with codable children: IMO0002

## 2013-06-06 HISTORY — DX: Bipolar disorder, unspecified: F31.9

## 2013-06-06 HISTORY — DX: Polyneuropathy, unspecified: G62.9

## 2013-06-06 HISTORY — DX: Unspecified osteoarthritis, unspecified site: M19.90

## 2013-06-06 HISTORY — DX: Migraine, unspecified, not intractable, without status migrainosus: G43.909

## 2013-06-06 MED ORDER — METOCLOPRAMIDE HCL 5 MG/ML IJ SOLN
10.0000 mg | Freq: Once | INTRAMUSCULAR | Status: AC
  Administered 2013-06-06: 10 mg via INTRAVENOUS
  Filled 2013-06-06: qty 2

## 2013-06-06 MED ORDER — DIPHENHYDRAMINE HCL 50 MG/ML IJ SOLN
25.0000 mg | Freq: Once | INTRAMUSCULAR | Status: DC
  Filled 2013-06-06: qty 1

## 2013-06-06 MED ORDER — DEXAMETHASONE SODIUM PHOSPHATE 10 MG/ML IJ SOLN
10.0000 mg | Freq: Once | INTRAMUSCULAR | Status: AC
  Administered 2013-06-06: 10 mg via INTRAVENOUS
  Filled 2013-06-06: qty 1

## 2013-06-06 MED ORDER — PROMETHAZINE HCL 25 MG PO TABS: 25.0000 mg | ORAL_TABLET | Freq: Four times a day (QID) | ORAL | Status: DC | PRN

## 2013-06-06 MED ORDER — SODIUM CHLORIDE 0.9 % IV BOLUS (SEPSIS)
1000.0000 mL | Freq: Once | INTRAVENOUS | Status: AC
  Administered 2013-06-06: 1000 mL via INTRAVENOUS

## 2013-06-06 NOTE — ED Notes (Signed)
Pt comfortable with d/c and f/u instructions. Prescriptions x1 

## 2013-06-06 NOTE — ED Notes (Signed)
Pt reports migraine HA that began approx 0200 today, pt w/ hx of medications, pt states she has taken her prescribed meds w/o relief and has attempted to contact her neurologist however unsuccessful. Pt admits to nausea denies vomiting. Pt admits to lightheadedness while at work today.

## 2013-06-06 NOTE — ED Provider Notes (Signed)
CSN: [id number]     Arrival date & time 06/06/13  1921 History   None    Chief Complaint  Patient presents with  . Migraine   (Consider location/radiation/quality/duration/timing/severity/associated sxs/prior Treatment) HPI History provided by pt.   Pt presents w/ severe migraine headache, that woke her at 2am today and has been stable throughout the day, despite taking percocet, advil and topamax.  Started in occipital region and eventually spread to entire head, worse behind the eyes.  Associated w/ photophobia and nausea.  Denies fever, vision changes, dizziness, extremity weakness/paresthesias.  No recent head trauma.  Has h/o migraines, and current pain typical, with exception that it woke her up and has lasted this long.  She was unable to get in touch with her neurologist.  Topamax dose recently increased, but this is her third migraine in the past month.  Past Medical History  Diagnosis Date  . Migraines   . Bipolar 1 disorder   . Neuropathy   . Arthritis   . Degenerative disk disease    Past Surgical History  Procedure Laterality Date  . Abdominal hysterectomy    . Tubal ligation    . Back surgery    . Leg surgery    . Carpal tunnel release     History reviewed. No pertinent family history. History  Substance Use Topics  . Smoking status: Former Smoker -- 0.50 packs/day    Types: Cigarettes    Quit date: 05/07/2013  . Smokeless tobacco: Not on file  . Alcohol Use: No   OB History   Grav Para Term Preterm Abortions TAB SAB Ect Mult Living                 Review of Systems  All other systems reviewed and are negative.    Allergies  Bee pollen; Amoxicillin; Aspirin; Erythromycin; Food; Penicillins; and Prozac  Home Medications   Current Outpatient Rx  Name  Route  Sig  Dispense  Refill  . Biotin 2500 MCG CAPS   Oral   Take 2,500 mcg by mouth daily.         . chlorzoxazone (PARAFON) 500 MG tablet   Oral   Take 500 mg by mouth 4 (four) times daily as  needed for muscle spasms.         . cholecalciferol (VITAMIN D) 1000 UNITS tablet   Oral   Take 1,000 Units by mouth daily.         Marland Kitchen EPINEPHrine (EPI-PEN) 0.3 mg/0.3 mL DEVI   Intramuscular   Inject 0.3 mg into the muscle once.         Marland Kitchen esomeprazole (NEXIUM) 40 MG capsule   Oral   Take 40 mg by mouth daily before breakfast.         . nortriptyline (PAMELOR) 25 MG capsule   Oral   Take 25 mg by mouth 2 (two) times daily.         Marland Kitchen oxyCODONE-acetaminophen (PERCOCET) 10-325 MG per tablet   Oral   Take 1 tablet by mouth every 4 (four) hours as needed for pain.         . pregabalin (LYRICA) 75 MG capsule   Oral   Take 75 mg by mouth daily.          . promethazine (PHENERGAN) 25 MG tablet   Oral   Take 1 tablet (25 mg total) by mouth every 6 (six) hours as needed for nausea.   20 tablet   0   .  solifenacin (VESICARE) 10 MG tablet   Oral   Take 10 mg by mouth daily.         Marland Kitchen topiramate (TOPAMAX) 25 MG tablet   Oral   Take 25 mg by mouth 2 (two) times daily.         . vitamin B-12 (CYANOCOBALAMIN) 1000 MCG tablet   Oral   Take 1,000 mcg by mouth daily.         . vitamin C (ASCORBIC ACID) 500 MG tablet   Oral   Take 500 mg by mouth daily.          BP 130/80  Pulse 87  Temp(Src) 98 F (36.7 C) (Oral)  Resp 16  SpO2 99% Physical Exam  Nursing note and vitals reviewed. Constitutional: She is oriented to person, place, and time. She appears well-developed and well-nourished.  HENT:  Head: Normocephalic and atraumatic.  No tenderness of sinuses or temples.   Eyes:  Normal appearance  Neck: Normal range of motion. Neck supple. No rigidity. No Brudzinski's sign and no Kernig's sign noted.  Cardiovascular: Normal rate, regular rhythm and intact distal pulses.   Pulmonary/Chest: Effort normal and breath sounds normal.  Musculoskeletal: Normal range of motion.  Neurological: She is alert and oriented to person, place, and time. No sensory  deficit. Coordination normal.  CN 3-12 intact.  No nystagmus.  5/5 and equal upper and lower extremity strength.  No past pointing.    Skin: Skin is warm and dry. No rash noted.  Psychiatric: She has a normal mood and affect. Her behavior is normal.    ED Course  Procedures (including critical care time) Labs Review Labs Reviewed - No data to display Imaging Review No results found.  MDM   1. Migraine    36yo F presents w/ headache that is typical of her migraines, with exception of prolonged duration and that it woke her from sleep early this morning.  She has a neurologist, has been compliant w/ topamax, and takes percocet/advil prn.  No recent head trauma.  On exam, afebrile, non-toxic appearing, no focal neuro deficits, no meningeal signs.  Pt received IVF, reglan and decadron and headache is nearly resolved.  She feels well enough to go home.  Recommended f/u w/ neuro next week d/t increased frequency of headaches and return to ER if sx worsen or are atypical over the weekend.  Prescribed po phenergan to be taken w/ benadryl to trial for pain. 9:08 PM     Otilio Miu, PA-C 06/06/13 2108

## 2013-06-06 NOTE — ED Notes (Signed)
PA at bedside.

## 2013-06-07 NOTE — ED Provider Notes (Signed)
Medical screening examination/treatment/procedure(s) were performed by non-physician practitioner and as supervising physician I was immediately available for consultation/collaboration.  Doug Sou, MD 06/07/13 [phone]

## 2013-06-09 ENCOUNTER — Telehealth: Payer: Self-pay | Admitting: *Deleted

## 2013-06-09 NOTE — Telephone Encounter (Signed)
I called pt.  Had migraine and had to go to ED.  She had migraine for 17 hours.  States that she does not have migraine now, but headache.  Taking the topamax 100mg  po bid.  No longer on lyrica.  Insurance as denied her MRI.  She has appt 07-15-13, what next?

## 2013-06-09 NOTE — Telephone Encounter (Signed)
Message copied by Hermenia Fiscal on Mon Jun 09, 2013  3:49 PM ------      Message from: Seth Bake      Created: Fri Jun 06, 2013  3:17 PM       Called earlier and has not received a call back.  Continues to have an awful migraine.  Please call.  [phone] ------

## 2013-06-10 MED ORDER — TRAMADOL HCL 50 MG PO TABS: 100.0000 mg | ORAL_TABLET | Freq: Four times a day (QID) | ORAL | Status: DC | PRN

## 2013-06-10 NOTE — Telephone Encounter (Signed)
I consulted [name].  Pt is off lyrica.  She is stated that she feels like headache getting worse again.    [name] ordered ultram 100mg  po every 6 hours prn #25, no refill.  Pt informed and is ok to use.   Had used in the past.

## 2013-06-10 NOTE — Telephone Encounter (Signed)
Pt went to ED

## 2013-06-10 NOTE — Telephone Encounter (Signed)
I consulted with [name] in MRI and pt is scheduled for MRI 06-11-13 at 1100.  I contacted pt and she is scheduled.   She had received call from [name] and she asked them about cost (medicaid).  Per [name], the initial denial, was overturned and she is approved.   Still awaiting per [name] about medication change.

## 2013-06-11 ENCOUNTER — Ambulatory Visit
Admission: RE | Admit: 2013-06-11 | Discharge: 2013-06-11 | Disposition: A | Discharge status: Home / Self Care | Payer: Medicaid Other | Source: Ambulatory Visit | Attending: Neurology | Admitting: Neurology

## 2013-06-11 DIAGNOSIS — R51 Headache: Secondary | ICD-10-CM

## 2013-06-11 MED ORDER — GADOBENATE DIMEGLUMINE 529 MG/ML IV SOLN
20.0000 mL | Freq: Once | INTRAVENOUS | Status: AC | PRN
  Administered 2013-06-11: 20 mL via INTRAVENOUS

## 2013-06-12 ENCOUNTER — Telehealth: Payer: Self-pay | Admitting: Nurse Practitioner

## 2013-06-12 NOTE — Telephone Encounter (Signed)
Left message of cancellation on 07/15/13 and ask pt to call back to reschedule, due to schedule change.

## 2013-07-02 ENCOUNTER — Encounter (INDEPENDENT_AMBULATORY_CARE_PROVIDER_SITE_OTHER): Payer: Self-pay

## 2013-07-02 ENCOUNTER — Ambulatory Visit (INDEPENDENT_AMBULATORY_CARE_PROVIDER_SITE_OTHER): Payer: Medicaid Other | Admitting: Nurse Practitioner

## 2013-07-02 ENCOUNTER — Encounter: Payer: Self-pay | Admitting: Nurse Practitioner

## 2013-07-02 VITALS — BP 107/70 | HR 80 | Temp 98.2°F | Ht 65.0 in | Wt 239.0 lb

## 2013-07-02 DIAGNOSIS — G43909 Migraine, unspecified, not intractable, without status migrainosus: Secondary | ICD-10-CM

## 2013-07-02 DIAGNOSIS — G43709 Chronic migraine without aura, not intractable, without status migrainosus: Secondary | ICD-10-CM

## 2013-07-02 MED ORDER — SUMATRIPTAN SUCCINATE 25 MG PO TABS: 50.0000 mg | ORAL_TABLET | ORAL | Status: DC | PRN

## 2013-07-02 MED ORDER — TOPIRAMATE 25 MG PO TABS: 100.0000 mg | ORAL_TABLET | Freq: Every day | ORAL | Status: DC

## 2013-07-02 MED ORDER — TRAMADOL HCL 50 MG PO TABS: 100.0000 mg | ORAL_TABLET | Freq: Four times a day (QID) | ORAL | Status: DC | PRN

## 2013-07-02 NOTE — Patient Instructions (Signed)
Patient was advised to stop smoking as it is a known headache trigger.   She was advised to discontinue ibuprofen decrease rebound headaches.  Limit coffee intake to less than 2 cups per day. Increase Topamax to 100 mg daily at bedtime. Start Sumatriptan 50 mg for severe headache, may repeat in 2 hours, no more than 2 in a 24 hour period. I advised her to do neck stretching exercises.  Return for followup in 3 months.

## 2013-07-02 NOTE — Progress Notes (Signed)
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 She states that Topamax seems to be helping her feet paresthesias. Her pain is not as frequent or severe. She is in fact managed to reduce the Lyrica to 75 mg at bedtime only. She continued to have chronic low back pain and had back injection by Dr. Ethelene Hal last week. She has a new complaint of daily headaches for the last 3 months. She has a long-standing history of migraine headaches but has been having more frequent headaches. She admits to taking 4-6 tablets of Advil every day for headaches which provided him with short-term relief. She says that she has at least 2-3 severe migraine headaches a week. She describes nausea light and sound sensitivity with her severe headaches but not for those other daily headaches. She is unable to the prior specific triggers for headaches. She admits to neck pain and muscle tightness. She has never been a migraine prophylaxis in the past. She seems without Topamax 50 mg twice daily without any side effects.  07/02/13 (LL): Patient comes back to office for 3 month revisit.  She states she has not seen improvement in her headaches.  She had to go to the ER last a few weeks ago for severe migraine with nausea.  She was given Benadryl and phenergan for nausea and decadron infusion.  It decreased the level of the headache and then after going to bed the next night it was relieved.  Her MRI of the brain showed non-specific white matter changes.  She describes headaches as being all over, tight, squeezing in nature.  She reports 4-5 headache days per week, each  lasting over 4 hours.  She is now taking 75 mg of Topamax all at night because she was experiencing cognitive problems when taking it in the morning.  She has weaned off of Lyrica completely due to memory loss.  She was increased to Nortriptyline 75 mg yesterday for her bipolar disorder.  She has tried Effexor, Lexapro, Prozac, Wellbutrin, Depakote, Keppra, and zoloft in the past for her bipolar disorder.  Her HIT-6 score today is 70.   ROS:  14 system review of systems is positive for snoring, constipation, itching, feeling hot, joint pain, aching muscles, allergies, headache, numbness, dizziness, depression, not enough sleep.   ALLERGIES: Allergies  Allergen Reactions  . Bee Pollen Anaphylaxis  . Amoxicillin Hives  . Aspirin Hives  . Erythromycin Hives  . Food     Raw tomatoes. Hives,rash,throat closing.   Marland Kitchen Penicillins Hives  . Prozac [Fluoxetine Hcl] Hives    HOME MEDICATIONS: Outpatient Prescriptions Prior to Visit  Medication Sig Dispense Refill  . Biotin 2500 MCG CAPS Take 2,500 mcg by mouth daily.      . chlorzoxazone (PARAFON) 500 MG tablet Take 500 mg by mouth 4 (four) times daily as needed for muscle spasms.      . cholecalciferol (VITAMIN D) 1000 UNITS tablet Take 1,000 Units by mouth daily.      Marland Kitchen EPINEPHrine (EPI-PEN) 0.3 mg/0.3 mL DEVI Inject 0.3 mg into the muscle once.      Marland Kitchen esomeprazole (NEXIUM) 40 MG capsule Take 40 mg by mouth daily  before breakfast.      . nortriptyline (PAMELOR) 25 MG capsule Take 25 mg by mouth 2 (two) times daily.      Marland Kitchen oxyCODONE-acetaminophen (PERCOCET) 10-325 MG per tablet Take 1 tablet by mouth every 4 (four) hours as needed for pain.      . vitamin B-12 (CYANOCOBALAMIN) 1000 MCG tablet Take 1,000 mcg by mouth daily.      . vitamin C (ASCORBIC ACID) 500 MG tablet Take 500 mg by mouth daily.      Marland Kitchen topiramate (TOPAMAX) 25 MG tablet Take 25 mg by mouth 2 (two) times daily.      . traMADol (ULTRAM) 50 MG tablet Take 2 tablets (100 mg total)  by mouth every 6 (six) hours as needed for pain (migraine).  25 tablet  0  . promethazine (PHENERGAN) 25 MG tablet Take 1 tablet (25 mg total) by mouth every 6 (six) hours as needed for nausea.  20 tablet  0  . solifenacin (VESICARE) 10 MG tablet Take 10 mg by mouth daily.       No facility-administered medications prior to visit.    PAST MEDICAL HISTORY: Past Medical History  Diagnosis Date  . Migraines   . Bipolar 1 disorder   . Neuropathy   . Arthritis   . Degenerative disk disease     PAST SURGICAL HISTORY: Past Surgical History  Procedure Laterality Date  . Abdominal hysterectomy    . Tubal ligation    . Back surgery    . Leg surgery    . Carpal tunnel release      FAMILY HISTORY: No family history on file.  SOCIAL HISTORY: History   Social History  . Marital Status: Married    Spouse Name: N/A    Number of Children: 2  . Years of Education: college   Occupational History  . collections    Social History Main Topics  . Smoking status: Former Smoker -- 0.50 packs/day    Types: Cigarettes    Quit date: 05/07/2013  . Smokeless tobacco: Not on file  . Alcohol Use: No  . Drug Use: No  . Sexual Activity: Yes   Other Topics Concern  . Not on file   Social History Narrative  . No narrative on file     PHYSICAL EXAM  Filed Vitals:   07/02/13 1037  BP: 107/70  Pulse: 80  Temp: 98.2 F (36.8 C)  TempSrc: Oral  Height: 5\' 5"  (1.651 m)  Weight: 239 lb (108.41 kg)   Body mass index is 39.77 kg/(m^2).  Generalized: Well developed, obese Caucasian female, in no acute distress  Head: normocephalic and atraumatic. Oropharynx benign  Neck: Supple, no carotid bruits  Cardiac: Regular rate rhythm, no murmur  Musculoskeletal: No deformity   Neurological examination  Mentation: Alert oriented to time, place, history taking. Follows all commands speech and language fluent Cranial nerve II-XII:   Pupils were equal round reactive to light extraocular  movements were full, visual field were full on confrontational test. Facial sensation and strength were normal. hearing was intact to finger rubbing bilaterally. Uvula tongue midline. head turning and shoulder shrug and were normal and symmetric. Motor: normal bulk and tone, full strength in the BUE, BLE, fine finger movements normal, no pronator drift. No focal weakness Sensory: normal and symmetric to light touch, pinprick, and  vibration  Coordination: finger-nose-finger, heel-to-shin bilaterally, no dysmetria Reflexes: 1+ and symmetric. Toes downgoing.  Gait and Station: Rising up from seated position without assistance, normal  stance, without trunk ataxia, moderate stride, good arm swing, smooth turning, able to perform tiptoe, and heel walking without difficulty.   DIAGNOSTIC DATA (LABS, IMAGING, TESTING) - I reviewed patient records, labs, notes, testing and imaging myself where available.  06/11/13 MRI BRAIN W/Wo - Slight abnormal MRI scan of the brain showing solitary nonspecific left parietal white matter tiny hyperintensity with the differential discussed above. No enhancing lesions are noted.  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 New-onset chronic daily headaches which likely represent transformed chronic migraine headaches with muscle tension headaches and rebound component.  If no better headache control at next visit, may try Propranolol or try to get Botox injections approved.  PLAN: Patient was advised to stop smoking as it is a known headache trigger.   She was advised to discontinue ibuprofen decrease rebound headaches.  Limit coffee intake to less than 2 cups per day. Increase Topamax to 100 mg daily at bedtime. Start Sumatriptan 50 mg for severe headache, may  repeat in 2 hours, no more than 2 in a 24 hour period. I advised her to do neck stretching exercises, reduce amount of preservatives in diet, and try to get regular exercise. Return for followup in 3 months.   Meds ordered this encounter  Medications  . DISCONTD: SUMAtriptan (IMITREX) 25 MG tablet    Sig: Take 2 tablets (50 mg total) by mouth every 2 (two) hours as needed for migraine. No more than 2 doses in a 24 hr period    Dispense:  15 tablet    Refill:  2    Order Specific Question:  Supervising Provider    Answer:  Joycelyn Schmid R [[id number]]  . traMADol (ULTRAM) 50 MG tablet    Sig: Take 2 tablets (100 mg total) by mouth every 6 (six) hours as needed for pain (migraine).    Dispense:  30 tablet    Refill:  2    Order Specific Question:  Supervising Provider    Answer:  Joycelyn Schmid R [[id number]]  . SUMAtriptan (IMITREX) 25 MG tablet    Sig: Take 2 tablets (50 mg total) by mouth every 2 (two) hours as needed for migraine. No more than 2 doses in a 24 hr period    Dispense:  15 tablet    Refill:  2    Order Specific Question:  Supervising Provider    Answer:  Joycelyn Schmid R [[id number]]  . topiramate (TOPAMAX) 25 MG tablet    Sig: Take 4 tablets (100 mg total) by mouth at bedtime.    Dispense:  120 tablet    Refill:  2    Order Specific Question:  Supervising Provider    Answer:  Joycelyn Schmid R [[id number]]    Tawny Asal LAM, MSN, NP-C 07/02/2013, 11:30 AM [facility] [address], [address] [place], [place] [address] [phone]

## 2013-07-15 ENCOUNTER — Ambulatory Visit: Payer: Medicaid Other | Admitting: Nurse Practitioner

## 2013-08-14 NOTE — Progress Notes (Signed)
I reviewed note and agree with plan.   Meral Geissinger R. Debi Cousin, MD  Certified in Neurology, Neurophysiology and Neuroimaging  [facility] [address], [address] [place], [place] [address] [phone]   

## 2013-10-01 ENCOUNTER — Encounter (INDEPENDENT_AMBULATORY_CARE_PROVIDER_SITE_OTHER): Payer: Self-pay

## 2013-10-01 ENCOUNTER — Encounter: Payer: Self-pay | Admitting: Nurse Practitioner

## 2013-10-01 ENCOUNTER — Ambulatory Visit (INDEPENDENT_AMBULATORY_CARE_PROVIDER_SITE_OTHER): Payer: Medicaid Other | Admitting: Nurse Practitioner

## 2013-10-01 VITALS — BP 119/74 | HR 86 | Ht 63.75 in | Wt 232.0 lb

## 2013-10-01 DIAGNOSIS — G43909 Migraine, unspecified, not intractable, without status migrainosus: Secondary | ICD-10-CM

## 2013-10-01 DIAGNOSIS — R202 Paresthesia of skin: Secondary | ICD-10-CM

## 2013-10-01 DIAGNOSIS — R209 Unspecified disturbances of skin sensation: Secondary | ICD-10-CM

## 2013-10-01 DIAGNOSIS — G43709 Chronic migraine without aura, not intractable, without status migrainosus: Secondary | ICD-10-CM

## 2013-10-01 MED ORDER — PROPRANOLOL HCL ER 60 MG PO CP24: 60.0000 mg | ORAL_CAPSULE | Freq: Every day | ORAL | Status: DC

## 2013-10-01 MED ORDER — TOPIRAMATE 100 MG PO TABS: 100.0000 mg | ORAL_TABLET | Freq: Every day | ORAL | Status: DC

## 2013-10-01 MED ORDER — TRAMADOL HCL 50 MG PO TABS: 100.0000 mg | ORAL_TABLET | Freq: Four times a day (QID) | ORAL | Status: DC | PRN

## 2013-10-01 MED ORDER — SUMATRIPTAN SUCCINATE 50 MG PO TABS: 50.0000 mg | ORAL_TABLET | Freq: Once | ORAL | Status: DC

## 2013-10-01 NOTE — Patient Instructions (Signed)
PLAN:  Start Propranolol LA 60 mg once daily at bedtime for Migraine prevention.  We may increase this at next visit. Continue Topamax to 100 mg daily at bedtime for neuropathy. Start Sumatriptan 50 mg for severe headache, may repeat in 2 hours, no more than 2 in a 24 hour period.  Continue Tramadol for tension-type headaches. Rx given. Return for followup in 3 months, sooner as needed.  Propranolol extended-release capsules What is this medicine? PROPRANOLOL (proe PRAN oh lole) is a beta-blocker. Beta-blockers reduce the workload on the heart and help it to beat more regularly. This medicine is used to treat high blood pressure, heart muscle disease, and prevent chest pain caused by angina. It is also used to prevent migraine headaches. You should not use this medicine to treat a migraine that has already started. This medicine may be used for other purposes; ask your health care provider or pharmacist if you have questions. COMMON BRAND NAME(S): Inderal LA, Inderal XL, InnoPran XL What should I tell my health care provider before I take this medicine? They need to know if you have any of these conditions: -circulation problems, or blood vessel disease -diabetes -history of heart attack or heart disease, vasospastic angina -kidney disease -liver disease -lung or breathing disease, like asthma or emphysema -pheochromocytoma -slow heart rate -thyroid disease -an unusual or allergic reaction to propranolol, other beta-blockers, medicines, foods, dyes, or preservatives -pregnant or trying to get pregnant -breast-feeding How should I use this medicine? Take this medicine by mouth with a glass of water. Follow the directions on the prescription label. Do not crush or chew. Take your doses at regular intervals. Do not take your medicine more often than directed. Do not stop taking except on the advice of your doctor or health care professional. Talk to your pediatrician regarding the use of  this medicine in children. Special care may be needed. Overdosage: If you think you have taken too much of this medicine contact a poison control center or emergency room at once. NOTE: This medicine is only for you. Do not share this medicine with others. What if I miss a dose? If you miss a dose, take it as soon as you can. If it is almost time for your next dose, take only that dose. Do not take double or extra doses. What may interact with this medicine? Do not take this medicine with any of the following medications: -feverfew -phenothiazines like chlorpromazine, mesoridazine, prochlorperazine, thioridazine  This medicine may also interact with the following medications: -aluminum hydroxide gel -antipyrine -antiviral medicines for HIV or AIDS -barbiturates like phenobarbital -certain medicines for blood pressure, heart disease, irregular heart beat -cimetidine -ciprofloxacin -diazepam -fluconazole -haloperidol -isoniazid -medicines for cholesterol like cholestyramine or colestipol -medicines for mental depression -medicines for migraine headache like almotriptan, eletriptan, frovatriptan, naratriptan, rizatriptan, sumatriptan, zolmitriptan -NSAIDs, medicines for pain and inflammation, like ibuprofen or naproxen -phenytoin -rifampin -teniposide -theophylline -thyroid medicines -tolbutamide -warfarin -zileuton This list may not describe all possible interactions. Give your health care provider a list of all the medicines, herbs, non-prescription drugs, or dietary supplements you use. Also tell them if you smoke, drink alcohol, or use illegal drugs. Some items may interact with your medicine. What should I watch for while using this medicine? Visit your doctor or health care professional for regular check ups. Contact your doctor right away if your symptoms worsen. Check your blood pressure and pulse rate regularly. Ask your health care professional what your blood pressure and  pulse rate should  be, and when you should contact them. Do not stop taking this medicine suddenly. This could lead to serious heart-related effects. You may get drowsy or dizzy. Do not drive, use machinery, or do anything that needs mental alertness until you know how this drug affects you. Do not stand or sit up quickly, especially if you are an older patient. This reduces the risk of dizzy or fainting spells. Alcohol can make you more drowsy and dizzy. Avoid alcoholic drinks. This medicine can affect blood sugar levels. If you have diabetes, check with your doctor or health care professional before you change your diet or the dose of your diabetic medicine. Do not treat yourself for coughs, colds, or pain while you are taking this medicine without asking your doctor or health care professional for advice. Some ingredients may increase your blood pressure. What side effects may I notice from receiving this medicine? Side effects that you should report to your doctor or health care professional as soon as possible: -allergic reactions like skin rash, itching or hives, swelling of the face, lips, or tongue -breathing problems -changes in blood sugar -cold hands or feet -difficulty sleeping, nightmares -dry peeling skin -hallucinations -muscle cramps or weakness -slow heart rate -swelling of the legs and ankles -vomiting Side effects that usually do not require medical attention (report to your doctor or health care professional if they continue or are bothersome): -change in sex drive or performance -diarrhea -dry sore eyes -hair loss -nausea -weak or tired This list may not describe all possible side effects. Call your doctor for medical advice about side effects. You may report side effects to FDA at 1-800-FDA-1088. Where should I keep my medicine? Keep out of the reach of children. Store at room temperature between 15 and 30 degrees C (59 and 86 degrees F). Protect from light, moisture  and freezing. Keep container tightly closed. Throw away any unused medicine after the expiration date. NOTE: This sheet is a summary. It may not cover all possible information. If you have questions about this medicine, talk to your doctor, pharmacist, or health care provider.  2014, [employer]vier/Gold Standard. (2013-04-25 14:58:56)

## 2013-10-01 NOTE — Progress Notes (Signed)
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  Chronic Migraines without aura.  04/14/2013 (PS): she is seen today for followup of the last visit on 07/03/12. She states that Topamax seems to be helping her feet paresthesias. Her pain is not as frequent or severe. She is in fact managed to reduce the Lyrica to 75 mg at bedtime only. She continued to have chronic low back pain and had back injection by Dr. Ethelene Halamos last week. She has a new complaint of daily headaches for the last 3 months. She has a long-standing history of migraine headaches but has been having more frequent headaches. She admits to taking 4-6 tablets of Advil every day for headaches which provided him with short-term relief. She says that she has at least 2-3 severe migraine headaches a week. She describes nausea light and sound sensitivity with her severe headaches but not for those other daily headaches. She is unable to the prior specific triggers for headaches. She admits to neck pain and muscle tightness. She has never been a migraine prophylaxis in the past. She seems without Topamax 50 mg twice daily without any side effects.   07/02/13 (LL): Patient comes back to office for 3 month revisit. She states she has not seen improvement in her headaches. She had to go to the ER last a few weeks ago for severe migraine with nausea. She was given Benadryl and phenergan for nausea and decadron infusion. It decreased the level of the headache and then after going to bed the next night it was relieved. Her MRI of the brain showed non-specific white matter changes. She describes headaches as being all over, tight, squeezing in nature. She reports 4-5 headache days  per week, each lasting over 4 hours. She is now taking 75 mg of Topamax all at night because she was experiencing cognitive problems when taking it in the morning. She has weaned off of Lyrica completely due to memory loss. She was increased to Nortriptyline 75 mg yesterday for her bipolar disorder. She has tried Effexor, Lexapro, Prozac, Wellbutrin, Depakote, Keppra, and zoloft in the past for her bipolar disorder. Her HIT-6 score today is 70.   10/01/13 (LL):  Patient comes back to office for 3 month revisit. She states she has not seen improvement in her headaches, but she has not had to go to the ER.  She brings a completed headache diary.  She is averaging 15 headaches days per month, each day with over 4 hours of headache.  Increased Topamax (100 mg  Daily) has not helped with her headaches but it does help with her painful neuropathy in her feet.  She continues with Nortriptyline 75 mg for her bipolar depression. She does not remember ever trying Propranolol as a headache preventative.  For acute medications she is using sumatriptan with moderate relief with 2nd dose, ibuprofen and Tramadol.  She has occasional nausea associated with the headaches.    ROS:  14 system review of systems is positive for fatigue, neck pain, neck stiffness, ringing in ears, light sensitivity, cough, insomnia, frequent waking, daytime sleepiness, snoring, allergies,  joint pain, back pain, aching muscles, muscle cramps, walking difficulty, headache, numbness, dizziness, passing out (09/23/13), depression, anxiety.  ALLERGIES: Allergies  Allergen Reactions  .  Bee Pollen Anaphylaxis  . Amoxicillin Hives  . Aspirin Hives  . Erythromycin Hives  . Food     Raw tomatoes. Hives,rash,throat closing.   Marland Kitchen Penicillins Hives  . Prozac [Fluoxetine Hcl] Hives    HOME MEDICATIONS: Outpatient Prescriptions Prior to Visit  Medication Sig Dispense Refill  . chlorzoxazone (PARAFON) 500 MG tablet Take 500 mg by mouth 4 (four)  times daily as needed for muscle spasms.      . cholecalciferol (VITAMIN D) 1000 UNITS tablet Take 1,000 Units by mouth daily.      Marland Kitchen EPINEPHrine (EPI-PEN) 0.3 mg/0.3 mL DEVI Inject 0.3 mg into the muscle once.      Marland Kitchen esomeprazole (NEXIUM) 40 MG capsule Take 40 mg by mouth daily before breakfast.      . nortriptyline (PAMELOR) 25 MG capsule Take 25 mg by mouth 2 (two) times daily.      Marland Kitchen oxyCODONE-acetaminophen (PERCOCET) 10-325 MG per tablet Take 1 tablet by mouth every 4 (four) hours as needed for pain.      . vitamin B-12 (CYANOCOBALAMIN) 1000 MCG tablet Take 1,000 mcg by mouth daily.      . Biotin 2500 MCG CAPS Take 2,500 mcg by mouth daily.      . SUMAtriptan (IMITREX) 25 MG tablet Take 2 tablets (50 mg total) by mouth every 2 (two) hours as needed for migraine. No more than 2 doses in a 24 hr period  15 tablet  2  . topiramate (TOPAMAX) 25 MG tablet Take 4 tablets (100 mg total) by mouth at bedtime.  120 tablet  2  . traMADol (ULTRAM) 50 MG tablet Take 2 tablets (100 mg total) by mouth every 6 (six) hours as needed for pain (migraine).  30 tablet  2  . vitamin C (ASCORBIC ACID) 500 MG tablet Take 500 mg by mouth daily.       No facility-administered medications prior to visit.    PAST MEDICAL HISTORY: Past Medical History  Diagnosis Date  . Migraines   . Bipolar 1 disorder   . Neuropathy   . Arthritis   . Degenerative disk disease     PAST SURGICAL HISTORY: Past Surgical History  Procedure Laterality Date  . Abdominal hysterectomy    . Tubal ligation    . Back surgery    . Leg surgery    . Carpal tunnel release      FAMILY HISTORY: No family history on file.  SOCIAL HISTORY: History   Social History  . Marital Status: Married    Spouse Name: jamal    Number of Children: 2  . Years of Education: college   Occupational History  . collections    Social History Main Topics  . Smoking status: Former Smoker -- 0.50 packs/day    Types: Cigarettes    Quit date:  05/07/2013  . Smokeless tobacco: Not on file  . Alcohol Use: No  . Drug Use: No  . Sexual Activity: Yes   Other Topics Concern  . Not on file   Social History Narrative  . No narrative on file     PHYSICAL EXAM  Filed Vitals:   10/01/13 1450  BP: 119/74  Pulse: 86  Height: 5' 3.75" (1.619 m)  Weight: 232 lb (105.235 kg)   Body mass index is 40.15 kg/(m^2).  Generalized: Well developed, obese Caucasian female, in no acute distress  Head: normocephalic and atraumatic. Oropharynx benign  Neck: Supple, no carotid bruits  Cardiac: Regular rate rhythm, no murmur  Musculoskeletal: No deformity   Neurological examination  Mentation: Alert oriented to time, place, history taking. Follows all commands speech and language fluent  Cranial nerve II-XII: Pupils were equal round reactive to light extraocular movements were full, visual field were full on confrontational test. Facial sensation and strength were normal. hearing was intact to finger rubbing bilaterally. Uvula tongue midline. head turning and shoulder shrug and were normal and symmetric.  Motor: normal bulk and tone, full strength in the BUE, BLE, fine finger movements normal, no pronator drift. No focal weakness  Sensory: normal and symmetric to light touch, pinprick, and vibration  Coordination: finger-nose-finger, heel-to-shin bilaterally, no dysmetria  Reflexes: 1+ and symmetric. Toes downgoing.  Gait and Station: Rising up from seated position without assistance, normal stance, without trunk ataxia, moderate stride, good arm swing, smooth turning, able to perform tiptoe, and heel walking without difficulty.    DIAGNOSTIC DATA (LABS, IMAGING, TESTING) - I reviewed patient records, labs, notes, testing and imaging myself where available.  06/11/13 MRI BRAIN W/Wo - Slight abnormal MRI scan of the brain showing solitary nonspecific left parietal white matter tiny hyperintensity with the differential discussed above. No  enhancing lesions are noted.   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 Chronic almost daily headaches which likely represent transformed chronic migraine headaches.    PLAN:  Start Propranolol LA 60 mg once daily at bedtime for Migraine prevention. Continue Topamax to 100 mg daily at bedtime for neuropathy. Continue Sumatriptan 50 mg for severe headache, may repeat in 2 hours, no more than 2 in a 24 hour period.  Continue Tramadol for tension-type headaches. Return for followup in 3 months.  No orders of the defined types were placed in this encounter.    Meds ordered this encounter  Medications  . propranolol ER (INDERAL LA) 60 MG 24 hr capsule    Sig: Take 1 capsule (60 mg total) by mouth at bedtime.    Dispense:  30 capsule    Refill:  5    Order Specific Question:  Supervising Provider    Answer:  Pearlean Brownie, PRAMOD S [[id number]]  . SUMAtriptan (IMITREX) 50 MG tablet    Sig: Take 1 tablet (50 mg total) by mouth once. May repeat x 1 in 1-2 hr. No more than 2 doses in a 24 hr period    Dispense:  15 tablet    Refill:  5    Order Specific Question:  Supervising Provider    Answer:  Micki Riley [[id number]]  . traMADol (ULTRAM) 50 MG tablet    Sig: Take 2 tablets (100 mg total) by mouth every 6 (six) hours as needed.    Dispense:  60 tablet    Refill:  5    Order Specific Question:  Supervising Provider    Answer:  Pearlean Brownie, PRAMOD S [[id number]]  . topiramate (TOPAMAX) 100 MG tablet    Sig: Take 1 tablet (100 mg total) by mouth at bedtime.    Dispense:  30 tablet    Refill:  5    Order Specific Question:  Supervising Provider    Answer:  Micki Riley [[id number]]   Return in about 3 months (around 12/30/2013).  Ronal Fear, MSN, NP-C 10/01/2013, 5:23 PM [facility] [address] [place], [place] [address] [phone]  Note: This  document was prepared with digital dictation and possible smart phrase technology. Any transcriptional errors that result from this process are unintentional.

## 2014-01-16 ENCOUNTER — Ambulatory Visit: Payer: Medicaid Other | Admitting: Nurse Practitioner

## 2014-04-22 ENCOUNTER — Ambulatory Visit: Payer: Medicaid Other | Admitting: Nurse Practitioner

## 2014-04-22 ENCOUNTER — Telehealth: Payer: Self-pay | Admitting: Nurse Practitioner

## 2014-04-22 NOTE — Telephone Encounter (Signed)
Patient was no show for today's office appointment.  

## 2014-04-26 ENCOUNTER — Other Ambulatory Visit: Payer: Self-pay | Admitting: Neurology

## 2014-12-16 ENCOUNTER — Other Ambulatory Visit: Payer: Self-pay | Admitting: Family Medicine

## 2014-12-16 DIAGNOSIS — N63 Unspecified lump in unspecified breast: Secondary | ICD-10-CM

## 2014-12-21 ENCOUNTER — Ambulatory Visit
Admission: RE | Admit: 2014-12-21 | Discharge: 2014-12-21 | Disposition: A | Discharge status: Home / Self Care | Payer: Medicaid Other | Source: Ambulatory Visit | Attending: Family Medicine | Admitting: Family Medicine

## 2014-12-21 DIAGNOSIS — N63 Unspecified lump in unspecified breast: Secondary | ICD-10-CM

## 2015-01-02 ENCOUNTER — Encounter (HOSPITAL_COMMUNITY): Payer: Self-pay | Admitting: *Deleted

## 2015-01-02 ENCOUNTER — Emergency Department (HOSPITAL_COMMUNITY)
Admission: EM | Admit: 2015-01-02 | Discharge: 2015-01-02 | Disposition: A | Discharge status: Home / Self Care | Payer: Medicaid Other | Attending: Emergency Medicine | Admitting: Emergency Medicine

## 2015-01-02 DIAGNOSIS — W1830XA Fall on same level, unspecified, initial encounter: Secondary | ICD-10-CM | POA: Insufficient documentation

## 2015-01-02 DIAGNOSIS — Y9289 Other specified places as the place of occurrence of the external cause: Secondary | ICD-10-CM | POA: Insufficient documentation

## 2015-01-02 DIAGNOSIS — M5417 Radiculopathy, lumbosacral region: Secondary | ICD-10-CM

## 2015-01-02 DIAGNOSIS — G43909 Migraine, unspecified, not intractable, without status migrainosus: Secondary | ICD-10-CM | POA: Insufficient documentation

## 2015-01-02 DIAGNOSIS — F319 Bipolar disorder, unspecified: Secondary | ICD-10-CM | POA: Insufficient documentation

## 2015-01-02 DIAGNOSIS — Z79899 Other long term (current) drug therapy: Secondary | ICD-10-CM | POA: Insufficient documentation

## 2015-01-02 DIAGNOSIS — R200 Anesthesia of skin: Secondary | ICD-10-CM | POA: Insufficient documentation

## 2015-01-02 DIAGNOSIS — Y998 Other external cause status: Secondary | ICD-10-CM | POA: Insufficient documentation

## 2015-01-02 DIAGNOSIS — Y9389 Activity, other specified: Secondary | ICD-10-CM | POA: Insufficient documentation

## 2015-01-02 DIAGNOSIS — Z880 Allergy status to penicillin: Secondary | ICD-10-CM | POA: Insufficient documentation

## 2015-01-02 DIAGNOSIS — G8929 Other chronic pain: Secondary | ICD-10-CM | POA: Insufficient documentation

## 2015-01-02 DIAGNOSIS — Z87891 Personal history of nicotine dependence: Secondary | ICD-10-CM | POA: Insufficient documentation

## 2015-01-02 DIAGNOSIS — Z043 Encounter for examination and observation following other accident: Secondary | ICD-10-CM | POA: Diagnosis present

## 2015-01-02 MED ORDER — PREDNISONE 20 MG PO TABS
60.0000 mg | ORAL_TABLET | Freq: Once | ORAL | Status: AC
  Administered 2015-01-02: 60 mg via ORAL
  Filled 2015-01-02: qty 3

## 2015-01-02 MED ORDER — HYDROMORPHONE HCL 1 MG/ML IJ SOLN
1.0000 mg | Freq: Once | INTRAMUSCULAR | Status: AC
  Administered 2015-01-02: 1 mg via INTRAMUSCULAR
  Filled 2015-01-02: qty 1

## 2015-01-02 MED ORDER — DIAZEPAM 5 MG PO TABS
5.0000 mg | ORAL_TABLET | Freq: Once | ORAL | Status: AC
Start: 2015-01-02 — End: 2015-01-02
  Administered 2015-01-02: 5 mg via ORAL
  Filled 2015-01-02: qty 1

## 2015-01-02 MED ORDER — PREDNISONE 20 MG PO TABS: 40.0000 mg | ORAL_TABLET | Freq: Every day | ORAL | Status: DC

## 2015-01-02 MED ORDER — METHOCARBAMOL 500 MG PO TABS: 500.0000 mg | ORAL_TABLET | Freq: Two times a day (BID) | ORAL | Status: DC

## 2015-01-02 NOTE — Discharge Instructions (Signed)
Continue pain medications. Take robaxin for muscle spasms as prescribed. Prednisone for inflammation. Follow up with Dr. Shon BatonBrooks and Dr. Jeannetta NapElkins for re evaluation. Return if any weakness to extremities or difficulty controlling bowels or bladder.   Lumbosacral Radiculopathy Lumbosacral radiculopathy is a pinched nerve or nerves in the low back (lumbosacral area). When this happens you may have weakness in your legs and may not be able to stand on your toes. You may have pain going down into your legs. There may be difficulties with walking normally. There are many causes of this problem. Sometimes this may happen from an injury, or simply from arthritis or boney problems. It may also be caused by other illnesses such as diabetes. If there is no improvement after treatment, further studies may be done to find the exact cause. DIAGNOSIS  X-rays may be needed if the problems become long standing. Electromyograms may be done. This study is one in which the working of nerves and muscles is studied. HOME CARE INSTRUCTIONS   Applications of ice packs may be helpful. Ice can be used in a plastic bag with a towel around it to prevent frostbite to skin. This may be used every 2 hours for 20 to 30 minutes, or as needed, while awake, or as directed by your caregiver.  Only take over-the-counter or prescription medicines for pain, discomfort, or fever as directed by your caregiver.  If physical therapy was prescribed, follow your caregiver's directions. SEEK IMMEDIATE MEDICAL CARE IF:   You have pain not controlled with medications.  You seem to be getting worse rather than better.  You develop increasing weakness in your legs.  You develop loss of bowel or bladder control.  You have difficulty with walking or balance, or develop clumsiness in the use of your legs.  You have a fever. MAKE SURE YOU:   Understand these instructions.  Will watch your condition.  Will get help right away if you are not  doing well or get worse. Document Released: 08/21/2005 Document Revised: 11/13/2011 Document Reviewed: 04/10/2008 [facility] Patient Information 2015 [place]. This information is not intended to replace advice given to you by your health care provider. Make sure you discuss any questions you have with your health care provider.

## 2015-01-02 NOTE — ED Provider Notes (Signed)
Patient with low back pain radiating to left leg to foot onset 3 days ago when lifting. Pain is worse when she straightens her back. Patient is currently in pain management for chronic back pain. On exam alert Glasgow Coma Score 15 and walks without difficulty and without limp. Isn't exhibiting no signs of cauda equina syndrome. Has no focal neurologic deficit. I don't feel she needs emergent imaging.  Doug SouSam Treyten Monestime, MD 01/02/15 2014

## 2015-01-02 NOTE — ED Notes (Signed)
PA student at bedside.

## 2015-01-02 NOTE — ED Notes (Addendum)
Pt states that she has fell several times this weekend. Pt states that she was lifting boxes on Wednesday and has had pain since then. Pt states that her left leg "gives out" when she stands. Pt ambulatory at triage. Pt reports hx of back pain. Denies LOC. Pt reports that pain is in lower back and extends into left leg.

## 2015-01-02 NOTE — ED Provider Notes (Signed)
CSN: [id number]     Arrival date & time 01/02/15  1733 History   First MD Initiated Contact with Patient 01/02/15 1854     Chief Complaint  Patient presents with  . Fall  . Leg Pain     (Consider location/radiation/quality/duration/timing/severity/associated sxs/prior Treatment) HPI [name] is a 39 y.o. female history of chronic back pain, states she was lifting heavy boxes 3 days ago when her pain worsens. Patient has had prior lumbar fusion, followed by Dr. Shon Baton with orthopedics, also followed by pain management. She states since lifting a box, pain has gotten worse than her normal chronic pain. It radiates into the left buttock and left leg. She reports new numbness to the left lateral leg all the way down to her thigh. She denies any weakness in the leg. She states that she has had 4 episodes over her left leg "gave out." She states this resulted in him falling each time. No injuries during the fall. She denies prior similar symptoms in the past. She reports seeing Dr. Shon Baton just a month ago and having MRI done at that time which showed small herniated disks. Dr. Shon Baton told her nothing to be done about this att this time. Patient denies any loss of bladder or bowel control. She denies any loss of sensation around her medial thighs or perineum. She denies any fever or chills. The patient is ambulatory otherwise. Patient is currently on Percocet 10 mg every 4 hours. Last tetanus just prior to coming to the emergency department.  Past Medical History  Diagnosis Date  . Migraines   . Bipolar 1 disorder   . Neuropathy   . Arthritis   . Degenerative disk disease    Past Surgical History  Procedure Laterality Date  . Abdominal hysterectomy    . Tubal ligation    . Back surgery    . Leg surgery    . Carpal tunnel release     No family history on file. History  Substance Use Topics  . Smoking status: Former Smoker -- 0.50 packs/day    Types: Cigarettes    Quit date:  05/07/2013  . Smokeless tobacco: Not on file  . Alcohol Use: No   OB History    No data available     Review of Systems  Constitutional: Negative for fever and chills.  Respiratory: Negative for cough, chest tightness and shortness of breath.   Cardiovascular: Negative for chest pain, palpitations and leg swelling.  Gastrointestinal: Negative for nausea, vomiting, abdominal pain and diarrhea.  Genitourinary: Negative for dysuria, flank pain, vaginal bleeding, vaginal discharge, vaginal pain and pelvic pain.  Musculoskeletal: Positive for back pain and gait problem. Negative for myalgias, neck pain and neck stiffness.  Skin: Negative for rash.  Neurological: Positive for numbness. Negative for dizziness, weakness and headaches.  All other systems reviewed and are negative.     Allergies  Bee pollen; Amoxicillin; Aspirin; Erythromycin; Food; Penicillins; and Prozac  Home Medications   Prior to Admission medications   Medication Sig Start Date End Date Taking? Authorizing Provider  chlorzoxazone (PARAFON) 500 MG tablet Take 500 mg by mouth 4 (four) times daily as needed for muscle spasms.    Historical Provider, MD  cholecalciferol (VITAMIN D) 1000 UNITS tablet Take 1,000 Units by mouth daily.    Historical Provider, MD  EPINEPHrine (EPI-PEN) 0.3 mg/0.3 mL DEVI Inject 0.3 mg into the muscle once.    Historical Provider, MD  esomeprazole (NEXIUM) 40 MG capsule Take 40 mg  by mouth daily before breakfast.    Historical Provider, MD  nortriptyline (PAMELOR) 25 MG capsule Take 25 mg by mouth 2 (two) times daily.    Historical Provider, MD  oxyCODONE-acetaminophen (PERCOCET) 10-325 MG per tablet Take 1 tablet by mouth every 4 (four) hours as needed for pain.    Historical Provider, MD  propranolol ER (INDERAL LA) 60 MG 24 hr capsule Take 1 capsule (60 mg total) by mouth at bedtime. 10/01/13   Ronal FearLynn E Lam, NP  SUMAtriptan (IMITREX) 50 MG tablet Take 1 tablet (50 mg total) by mouth once. May  repeat x 1 in 1-2 hr. No more than 2 doses in a 24 hr period 10/01/13   Ronal FearLynn E Lam, NP  topiramate (TOPAMAX) 100 MG tablet Take 1 tablet (100 mg total) by mouth at bedtime. 10/01/13   Ronal FearLynn E Lam, NP  traMADol (ULTRAM) 50 MG tablet Take 2 tablets (100 mg total) by mouth every 6 (six) hours as needed. 10/01/13   Ronal FearLynn E Lam, NP  vitamin B-12 (CYANOCOBALAMIN) 1000 MCG tablet Take 1,000 mcg by mouth daily.    Historical Provider, MD   BP 118/87 mmHg  Pulse 78  Temp(Src) 98.5 F (36.9 C) (Oral)  Resp 18  SpO2 98% Physical Exam  Constitutional: She is oriented to person, place, and time. She appears well-developed and well-nourished. No distress.  HENT:  Head: Normocephalic.  Eyes: Conjunctivae are normal.  Neck: Neck supple.  Cardiovascular: Normal rate, regular rhythm and normal heart sounds.   Pulmonary/Chest: Effort normal and breath sounds normal. No respiratory distress. She has no wheezes. She has no rales.  Abdominal: Soft. Bowel sounds are normal. She exhibits no distension. There is no tenderness. There is no rebound.  Musculoskeletal: She exhibits no edema.  Midline lumbar spine tenderness to palpation. Tender in the left SI joint, left buttock. Pain but left straight leg raise.  Neurological: She is alert and oriented to person, place, and time.  5/5 and equal lower extremity strength. 2+ and equal patellar reflexes bilaterally. Pt able to dorsiflex bilateral toes and feet with good strength against resistance. Decreased sensation to the lateral let thigh and left lower leg into the foot. Otherwise normal sensation.    Skin: Skin is warm and dry.  Psychiatric: She has a normal mood and affect. Her behavior is normal.  Nursing note and vitals reviewed.   ED Course  Procedures (including critical care time) Labs Review Labs Reviewed - No data to display  Imaging Review No results found.   EKG Interpretation None      MDM   Final diagnoses:  Lumbosacral radiculopathy     Patient with chronic back pain, here with worsening pain after lifting heavy boxes 2 days ago, with new numbness in the left leg. Extremities unremarkable except for noted in the morning. Sensation of left leg. No evidence of cauda equina. Patient is ambulatory, ambulated in the hallway here with no foot drop, no difficulties, slow due to pain. Patient takes Percocet 10 mg every 4 hours for pain at home on regular basis. Discussed with Dr. Rennis ChrisJacobowitz, no imaging indicated at this time. There is no indication for emergent MRI. She is neurovascularly intact. Discussed with patient signs and symptoms that should bring her to emergency department. He'll start prednisone, will follow up with Dr. Shon BatonBrooks who is her orthopedic surgeon and with Dr. Jeannetta NapElkins who is her pain management doctor. Return precautions discussed.  Filed Vitals:   01/02/15 2000 01/02/15 2030 01/02/15 2100 01/02/15  2100  BP: 132/84 129/80 125/80   Pulse: 65 77 74   Temp:    98.4 F (36.9 C)  TempSrc:    Oral  Resp: SpO2: 97% 95% 96%      Jaynie Crumble, PA-C 01/03/15 0130  Doug Sou, MD 01/03/15 [phone]

## 2015-06-08 ENCOUNTER — Other Ambulatory Visit: Payer: Self-pay | Admitting: Gastroenterology

## 2015-06-08 DIAGNOSIS — R1111 Vomiting without nausea: Secondary | ICD-10-CM

## 2015-06-15 ENCOUNTER — Ambulatory Visit
Admission: RE | Admit: 2015-06-15 | Discharge: 2015-06-15 | Disposition: A | Discharge status: Home / Self Care | Payer: Medicaid Other | Source: Ambulatory Visit | Attending: Gastroenterology | Admitting: Gastroenterology

## 2015-06-15 DIAGNOSIS — R1111 Vomiting without nausea: Secondary | ICD-10-CM

## 2015-06-21 ENCOUNTER — Other Ambulatory Visit: Payer: Medicaid Other

## 2015-06-25 ENCOUNTER — Other Ambulatory Visit: Payer: Medicaid Other

## 2015-07-06 ENCOUNTER — Other Ambulatory Visit: Payer: Medicaid Other

## 2016-02-03 ENCOUNTER — Ambulatory Visit: Payer: Self-pay | Admitting: Physician Assistant

## 2016-02-18 NOTE — Pre-Procedure Instructions (Signed)
[name]  02/18/2016      [id number] [address] [place] [address] Phone: [phone] Fax: [phone]    Your procedure is scheduled on June 28  Wednesday   Report to [facility] Admitting at 630 A.M.   Call this number if you have problems the morning of surgery:  [phone]   Remember:  Do not eat food or drink liquids after midnight.   Take these medicines the morning of surgery with A SIP OF WATER albuterol inhaler if needed,Duloxetine(Cymbalta),Nexium,Pain medication as needed,   Stop taking aspirin, Ibuprofen, Advil, Motrin, BC's, Goody's, Herbal medication, Fish Oil, Vitamins, aleve   Do not wear jewelry, make-up or nail polish.  Do not wear lotions, powders, or perfumes.  You may not wear deoderant.   Do not shave 48 hours prior to surgery.     Do not bring valuables to the hospital.  [facility] is not responsible for any belongings or valuables.  Contacts, dentures or bridgework may not be worn into surgery.  Leave your suitcase in the car.  After surgery it may be brought to your room.  For patients admitted to the hospital, discharge time will be determined by your treatment team.  Patients discharged the day of surgery will not be allowed to drive home.    Special instructions:  [facility] - Preparing for Surgery  Before surgery, you can play an important role.  Because skin is not sterile, your skin needs to be as free of germs as possible.  You can reduce the number of germs on you skin by washing with CHG (chlorahexidine gluconate) soap before surgery.  CHG is an antiseptic cleaner which kills germs and bonds with the skin to continue killing germs even after washing.  Please DO NOT use if you have an allergy to CHG or antibacterial soaps.  If your skin becomes reddened/irritated stop using the CHG and inform your nurse when you arrive at Short Stay.  Do not shave  (including legs and underarms) for at least 48 hours prior to the first CHG shower.  You may shave your face.  Please follow these instructions carefully:   1.  Shower with CHG Soap the night before surgery and the  morning of Surgery.  2.  If you choose to wash your hair, wash your hair first as usual with your normal shampoo.  3.  After you shampoo, rinse your hair and body thoroughly to remove the   Shampoo.  4.  Use CHG as you would any other liquid soap.  You can apply chg directly  to the skin and wash gently with scrungie or a clean washcloth.  5.  Apply the CHG Soap to your body ONLY FROM THE NECK DOWN.   Do not use on open wounds or open sores.  Avoid contact with your eyes,   ears, mouth and genitals (private parts).  Wash genitals (private parts) with your normal soap.  6.  Wash thoroughly, paying special attention to the area where your surgery will be performed.  7.  Thoroughly rinse your body with warm water from the neck down.  8.  DO NOT shower/wash with your normal soap after using and rinsing off the CHG Soap.  9.  Pat yourself dry with a clean towel.            10.  Wear clean pajamas.  11.  Place clean sheets on your bed the night of your first shower and do not sleep with pets.  Day of Surgery  Do not apply any lotions/deoderants the morning of surgery.  Please wear clean clothes to the hospital/surgery center.     Please read over the following fact sheets that you were given.MRSA ,surgical site infections

## 2016-02-21 ENCOUNTER — Encounter (HOSPITAL_COMMUNITY): Payer: Self-pay

## 2016-02-21 ENCOUNTER — Encounter (HOSPITAL_COMMUNITY)
Admission: RE | Admit: 2016-02-21 | Discharge: 2016-02-21 | Disposition: A | Discharge status: Home / Self Care | Payer: Medicaid Other | Source: Ambulatory Visit | Attending: Orthopedic Surgery | Admitting: Orthopedic Surgery

## 2016-02-21 DIAGNOSIS — Z87891 Personal history of nicotine dependence: Secondary | ICD-10-CM | POA: Diagnosis not present

## 2016-02-21 DIAGNOSIS — R9431 Abnormal electrocardiogram [ECG] [EKG]: Secondary | ICD-10-CM | POA: Diagnosis not present

## 2016-02-21 DIAGNOSIS — Z01818 Encounter for other preprocedural examination: Secondary | ICD-10-CM | POA: Insufficient documentation

## 2016-02-21 DIAGNOSIS — Z79899 Other long term (current) drug therapy: Secondary | ICD-10-CM | POA: Insufficient documentation

## 2016-02-21 DIAGNOSIS — Z0183 Encounter for blood typing: Secondary | ICD-10-CM | POA: Diagnosis not present

## 2016-02-21 DIAGNOSIS — M5410 Radiculopathy, site unspecified: Secondary | ICD-10-CM | POA: Insufficient documentation

## 2016-02-21 DIAGNOSIS — Z01812 Encounter for preprocedural laboratory examination: Secondary | ICD-10-CM | POA: Insufficient documentation

## 2016-02-21 DIAGNOSIS — K219 Gastro-esophageal reflux disease without esophagitis: Secondary | ICD-10-CM | POA: Insufficient documentation

## 2016-02-21 HISTORY — DX: Depression, unspecified: F32.A

## 2016-02-21 HISTORY — DX: Anxiety disorder, unspecified: F41.9

## 2016-02-21 HISTORY — DX: Major depressive disorder, single episode, unspecified: F32.9

## 2016-02-21 HISTORY — DX: Fibromyalgia: M79.7

## 2016-02-21 HISTORY — DX: Other seasonal allergic rhinitis: J30.2

## 2016-02-21 HISTORY — DX: Gastro-esophageal reflux disease without esophagitis: K21.9

## 2016-02-21 LAB — BASIC METABOLIC PANEL
Anion gap: 7 (ref 5–15)
BUN: 5 mg/dL — ABNORMAL LOW (ref 6–20)
CHLORIDE: 105 mmol/L (ref 101–111)
CO2: 23 mmol/L (ref 22–32)
CREATININE: 0.56 mg/dL (ref 0.44–1.00)
Calcium: 9 mg/dL (ref 8.9–10.3)
GFR calc non Af Amer: >60 mL/min (ref >60)
Glucose, Bld: 108 mg/dL — ABNORMAL HIGH (ref 65–99)
Potassium: 4.1 mmol/L (ref 3.5–5.1)
SODIUM: 135 mmol/L (ref 135–145)

## 2016-02-21 LAB — CBC
HCT: 39.9 % (ref 36.0–46.0)
Hemoglobin: 12.9 g/dL (ref 12.0–15.0)
MCH: 30.6 pg (ref 26.0–34.0)
MCHC: 32.3 g/dL (ref 30.0–36.0)
MCV: 94.5 fL (ref 78.0–100.0)
PLATELETS: 275 10*3/uL (ref 150–400)
RBC: 4.22 MIL/uL (ref 3.87–5.11)
RDW: 13 % (ref 11.5–15.5)
WBC: 9.6 10*3/uL (ref 4.0–10.5)

## 2016-02-21 LAB — SURGICAL PCR SCREEN
MRSA, PCR: NEGATIVE
Staphylococcus aureus: POSITIVE — AB

## 2016-02-21 NOTE — Progress Notes (Signed)
Pt. Notified of PCR positive for Staph.  [facility] [place]

## 2016-02-22 NOTE — Progress Notes (Signed)
Anesthesia Chart Review:  Pt is a 40 year old female scheduled for L4-5  transforaminal lumbar interbody fusion, L3-4 discectomy on 03/01/2016 with Dr. Shon BatonBrooks.   PMH includes:  Bipolar disorder, GERD. Former smoker. BMI 38  Medications include: albuterol, nexium, propranolol  Preoperative labs reviewed.    EKG 02/21/16: NSR. Possible Left atrial enlargement. Septal infarct, age undetermined  If no changes, I anticipate pt can proceed with surgery as scheduled.   Rica Mastngela Kabbe, FNP-BC [facility]/Anesthesiology Phone: [phone] 02/22/2016 4:23 PM

## 2016-02-28 LAB — TYPE AND SCREEN
ABO/RH(D): A POS
Antibody Screen: NEGATIVE
DAT, IgG: NEGATIVE
Donor AG Type: NEGATIVE
Donor AG Type: NEGATIVE
UNIT DIVISION: 0
Unit division: 0

## 2016-02-29 MED ORDER — VANCOMYCIN HCL 10 G IV SOLR
1500.0000 mg | INTRAVENOUS | Status: AC
  Administered 2016-03-01: 1500 mg via INTRAVENOUS
  Filled 2016-02-29: qty 1500

## 2016-03-01 ENCOUNTER — Encounter (HOSPITAL_COMMUNITY): Payer: Self-pay | Admitting: Certified Registered Nurse Anesthetist

## 2016-03-01 ENCOUNTER — Inpatient Hospital Stay (HOSPITAL_COMMUNITY)
Admission: RE | Admit: 2016-03-01 | Discharge: 2016-03-03 | DRG: 460 | Disposition: A | Discharge status: Home / Self Care | Payer: Medicaid Other | Source: Ambulatory Visit | Attending: Orthopedic Surgery | Admitting: Orthopedic Surgery

## 2016-03-01 ENCOUNTER — Inpatient Hospital Stay (HOSPITAL_COMMUNITY): Payer: Medicaid Other | Admitting: Certified Registered Nurse Anesthetist

## 2016-03-01 ENCOUNTER — Inpatient Hospital Stay (HOSPITAL_COMMUNITY): Payer: Medicaid Other | Admitting: Emergency Medicine

## 2016-03-01 ENCOUNTER — Inpatient Hospital Stay (HOSPITAL_COMMUNITY): Payer: Medicaid Other

## 2016-03-01 ENCOUNTER — Encounter (HOSPITAL_COMMUNITY)
Admission: RE | Disposition: A | Discharge status: Home / Self Care | Payer: Self-pay | Source: Ambulatory Visit | Attending: Orthopedic Surgery

## 2016-03-01 DIAGNOSIS — Z91030 Bee allergy status: Secondary | ICD-10-CM

## 2016-03-01 DIAGNOSIS — Z888 Allergy status to other drugs, medicaments and biological substances status: Secondary | ICD-10-CM

## 2016-03-01 DIAGNOSIS — K219 Gastro-esophageal reflux disease without esophagitis: Secondary | ICD-10-CM | POA: Diagnosis present

## 2016-03-01 DIAGNOSIS — G8929 Other chronic pain: Secondary | ICD-10-CM | POA: Diagnosis present

## 2016-03-01 DIAGNOSIS — Z90710 Acquired absence of both cervix and uterus: Secondary | ICD-10-CM | POA: Diagnosis not present

## 2016-03-01 DIAGNOSIS — Z79899 Other long term (current) drug therapy: Secondary | ICD-10-CM

## 2016-03-01 DIAGNOSIS — Z419 Encounter for procedure for purposes other than remedying health state, unspecified: Secondary | ICD-10-CM

## 2016-03-01 DIAGNOSIS — M797 Fibromyalgia: Secondary | ICD-10-CM | POA: Diagnosis not present

## 2016-03-01 DIAGNOSIS — M5134 Other intervertebral disc degeneration, thoracic region: Secondary | ICD-10-CM | POA: Diagnosis present

## 2016-03-01 DIAGNOSIS — M4806 Spinal stenosis, lumbar region: Secondary | ICD-10-CM | POA: Diagnosis not present

## 2016-03-01 DIAGNOSIS — Z880 Allergy status to penicillin: Secondary | ICD-10-CM

## 2016-03-01 DIAGNOSIS — M4316 Spondylolisthesis, lumbar region: Secondary | ICD-10-CM | POA: Diagnosis present

## 2016-03-01 DIAGNOSIS — M5116 Intervertebral disc disorders with radiculopathy, lumbar region: Secondary | ICD-10-CM | POA: Diagnosis present

## 2016-03-01 DIAGNOSIS — Z09 Encounter for follow-up examination after completed treatment for conditions other than malignant neoplasm: Secondary | ICD-10-CM

## 2016-03-01 DIAGNOSIS — Z90721 Acquired absence of ovaries, unilateral: Secondary | ICD-10-CM

## 2016-03-01 DIAGNOSIS — M549 Dorsalgia, unspecified: Secondary | ICD-10-CM | POA: Diagnosis present

## 2016-03-01 DIAGNOSIS — F319 Bipolar disorder, unspecified: Secondary | ICD-10-CM | POA: Diagnosis not present

## 2016-03-01 DIAGNOSIS — M961 Postlaminectomy syndrome, not elsewhere classified: Secondary | ICD-10-CM | POA: Diagnosis present

## 2016-03-01 DIAGNOSIS — Z886 Allergy status to analgesic agent status: Secondary | ICD-10-CM | POA: Diagnosis not present

## 2016-03-01 HISTORY — PX: TRANSFORAMINAL LUMBAR INTERBODY FUSION (TLIF) WITH PEDICLE SCREW FIXATION 1 LEVEL: SHX6141

## 2016-03-01 LAB — TYPE AND SCREEN
ABO/RH(D): A POS
ANTIBODY SCREEN: NEGATIVE

## 2016-03-01 SURGERY — TRANSFORAMINAL LUMBAR INTERBODY FUSION (TLIF) WITH PEDICLE SCREW FIXATION 1 LEVEL
Anesthesia: General

## 2016-03-01 MED ORDER — SODIUM CHLORIDE 0.9% FLUSH: 3.0000 mL | INTRAVENOUS | Status: DC | PRN

## 2016-03-01 MED ORDER — SUCCINYLCHOLINE CHLORIDE 200 MG/10ML IV SOSY
PREFILLED_SYRINGE | INTRAVENOUS | Status: AC
  Filled 2016-03-01: qty 10

## 2016-03-01 MED ORDER — ACETAMINOPHEN 10 MG/ML IV SOLN
INTRAVENOUS | Status: DC | PRN
  Administered 2016-03-01: 1000 mg via INTRAVENOUS

## 2016-03-01 MED ORDER — 0.9 % SODIUM CHLORIDE (POUR BTL) OPTIME
TOPICAL | Status: DC | PRN
  Administered 2016-03-01 (×2): 1000 mL

## 2016-03-01 MED ORDER — BUPIVACAINE-EPINEPHRINE 0.25% -1:200000 IJ SOLN
INTRAMUSCULAR | Status: DC | PRN
  Administered 2016-03-01: 20 mL

## 2016-03-01 MED ORDER — PROPOFOL 500 MG/50ML IV EMUL
INTRAVENOUS | Status: DC | PRN
  Administered 2016-03-01: 50 ug/kg/min via INTRAVENOUS
  Administered 2016-03-01 (×2): via INTRAVENOUS

## 2016-03-01 MED ORDER — HYDROMORPHONE HCL 1 MG/ML IJ SOLN
0.2500 mg | INTRAMUSCULAR | Status: DC | PRN
  Administered 2016-03-01 (×4): 0.5 mg via INTRAVENOUS

## 2016-03-01 MED ORDER — ARTIFICIAL TEARS OP OINT
TOPICAL_OINTMENT | OPHTHALMIC | Status: AC
  Filled 2016-03-01: qty 3.5

## 2016-03-01 MED ORDER — ALBUTEROL SULFATE (2.5 MG/3ML) 0.083% IN NEBU: 3.0000 mL | INHALATION_SOLUTION | Freq: Four times a day (QID) | RESPIRATORY_TRACT | Status: DC | PRN

## 2016-03-01 MED ORDER — HYDROMORPHONE HCL 1 MG/ML IJ SOLN
INTRAMUSCULAR | Status: AC
  Filled 2016-03-01: qty 1

## 2016-03-01 MED ORDER — MAGNESIUM CITRATE PO SOLN
0.5000 | Freq: Once | ORAL | Status: AC
  Administered 2016-03-01: 0.5 via ORAL
  Filled 2016-03-01: qty 296

## 2016-03-01 MED ORDER — ARTIFICIAL TEARS OP OINT
TOPICAL_OINTMENT | OPHTHALMIC | Status: DC | PRN
Start: 2016-03-01 — End: 2016-03-01
  Administered 2016-03-01: 1 via OPHTHALMIC

## 2016-03-01 MED ORDER — PHENYLEPHRINE HCL 10 MG/ML IJ SOLN
10.0000 mg | INTRAMUSCULAR | Status: DC | PRN
  Administered 2016-03-01: 50 ug/min via INTRAVENOUS

## 2016-03-01 MED ORDER — EPHEDRINE SULFATE 50 MG/ML IJ SOLN
INTRAMUSCULAR | Status: DC | PRN
  Administered 2016-03-01: 10 mg via INTRAVENOUS

## 2016-03-01 MED ORDER — GLYCOPYRROLATE 0.2 MG/ML IV SOSY
PREFILLED_SYRINGE | INTRAVENOUS | Status: AC
  Filled 2016-03-01: qty 3

## 2016-03-01 MED ORDER — METHOCARBAMOL 1000 MG/10ML IJ SOLN: 500.0000 mg | Freq: Four times a day (QID) | INTRAVENOUS | Status: DC | PRN

## 2016-03-01 MED ORDER — MIDAZOLAM HCL 2 MG/2ML IJ SOLN
INTRAMUSCULAR | Status: AC
  Filled 2016-03-01: qty 2

## 2016-03-01 MED ORDER — LACTATED RINGERS IV SOLN
INTRAVENOUS | Status: DC | PRN
  Administered 2016-03-01 (×2): via INTRAVENOUS

## 2016-03-01 MED ORDER — ACETAMINOPHEN 10 MG/ML IV SOLN
INTRAVENOUS | Status: AC
Start: 2016-03-01 — End: 2016-03-01
  Filled 2016-03-01: qty 100

## 2016-03-01 MED ORDER — DEXAMETHASONE SODIUM PHOSPHATE 10 MG/ML IJ SOLN
INTRAMUSCULAR | Status: DC | PRN
  Administered 2016-03-01: 10 mg via INTRAVENOUS

## 2016-03-01 MED ORDER — PROPOFOL 10 MG/ML IV BOLUS
INTRAVENOUS | Status: AC
  Filled 2016-03-01: qty 20

## 2016-03-01 MED ORDER — PROPOFOL 1000 MG/100ML IV EMUL
INTRAVENOUS | Status: AC
  Filled 2016-03-01: qty 100

## 2016-03-01 MED ORDER — OXYCODONE-ACETAMINOPHEN 10-325 MG PO TABS: 1.0000 | ORAL_TABLET | ORAL | Status: DC | PRN

## 2016-03-01 MED ORDER — DEXAMETHASONE SODIUM PHOSPHATE 10 MG/ML IJ SOLN
INTRAMUSCULAR | Status: AC
  Filled 2016-03-01: qty 1

## 2016-03-01 MED ORDER — DEXAMETHASONE 4 MG PO TABS
4.0000 mg | ORAL_TABLET | Freq: Four times a day (QID) | ORAL | Status: DC
  Administered 2016-03-01 – 2016-03-03 (×7): 4 mg via ORAL
  Filled 2016-03-01 (×7): qty 1

## 2016-03-01 MED ORDER — PROMETHAZINE HCL 25 MG/ML IJ SOLN: 6.2500 mg | INTRAMUSCULAR | Status: DC | PRN

## 2016-03-01 MED ORDER — ROCURONIUM BROMIDE 50 MG/5ML IV SOLN
INTRAVENOUS | Status: AC
  Filled 2016-03-01: qty 1

## 2016-03-01 MED ORDER — PHENYLEPHRINE HCL 10 MG/ML IJ SOLN
INTRAMUSCULAR | Status: DC | PRN
  Administered 2016-03-01 (×2): 80 ug via INTRAVENOUS
  Administered 2016-03-01: 160 ug via INTRAVENOUS
  Administered 2016-03-01: 80 ug via INTRAVENOUS

## 2016-03-01 MED ORDER — SODIUM CHLORIDE 0.9 % IV SOLN: 250.0000 mL | INTRAVENOUS | Status: DC

## 2016-03-01 MED ORDER — DEXAMETHASONE SODIUM PHOSPHATE 4 MG/ML IJ SOLN
4.0000 mg | Freq: Four times a day (QID) | INTRAMUSCULAR | Status: DC
  Administered 2016-03-01: 4 mg via INTRAVENOUS
  Filled 2016-03-01: qty 1

## 2016-03-01 MED ORDER — ONDANSETRON HCL 4 MG PO TABS: 4.0000 mg | ORAL_TABLET | Freq: Three times a day (TID) | ORAL | Status: DC | PRN

## 2016-03-01 MED ORDER — OXYCODONE HCL 5 MG/5ML PO SOLN: 10.0000 mg | Freq: Once | ORAL | Status: DC | PRN

## 2016-03-01 MED ORDER — SURGIFOAM 100 EX MISC
CUTANEOUS | Status: DC | PRN
  Administered 2016-03-01: 1 via TOPICAL

## 2016-03-01 MED ORDER — DULOXETINE HCL 30 MG PO CPEP
30.0000 mg | ORAL_CAPSULE | Freq: Two times a day (BID) | ORAL | Status: DC
  Administered 2016-03-01 – 2016-03-02 (×3): 30 mg via ORAL
  Filled 2016-03-01 (×3): qty 1

## 2016-03-01 MED ORDER — PANTOPRAZOLE SODIUM 40 MG PO TBEC
40.0000 mg | DELAYED_RELEASE_TABLET | Freq: Every day | ORAL | Status: DC
Start: 2016-03-02 — End: 2016-03-03
  Administered 2016-03-02: 40 mg via ORAL
  Filled 2016-03-01: qty 1

## 2016-03-01 MED ORDER — OXYCODONE HCL 5 MG PO TABS: 10.0000 mg | ORAL_TABLET | Freq: Once | ORAL | Status: DC | PRN

## 2016-03-01 MED ORDER — METHOCARBAMOL 500 MG PO TABS: 500.0000 mg | ORAL_TABLET | Freq: Three times a day (TID) | ORAL | Status: DC | PRN

## 2016-03-01 MED ORDER — FLEET ENEMA 7-19 GM/118ML RE ENEM
1.0000 | ENEMA | Freq: Once | RECTAL | Status: AC
  Administered 2016-03-02: 1 via RECTAL
  Filled 2016-03-01: qty 1

## 2016-03-01 MED ORDER — LACTATED RINGERS IV SOLN
INTRAVENOUS | Status: DC | PRN
  Administered 2016-03-01: 10:00:00 via INTRAVENOUS

## 2016-03-01 MED ORDER — LIDOCAINE 2% (20 MG/ML) 5 ML SYRINGE
INTRAMUSCULAR | Status: AC
  Filled 2016-03-01: qty 5

## 2016-03-01 MED ORDER — EPHEDRINE 5 MG/ML INJ
INTRAVENOUS | Status: AC
  Filled 2016-03-01: qty 10

## 2016-03-01 MED ORDER — LACTATED RINGERS IV SOLN: INTRAVENOUS | Status: DC

## 2016-03-01 MED ORDER — PHENYLEPHRINE 40 MCG/ML (10ML) SYRINGE FOR IV PUSH (FOR BLOOD PRESSURE SUPPORT)
PREFILLED_SYRINGE | INTRAVENOUS | Status: AC
  Filled 2016-03-01: qty 10

## 2016-03-01 MED ORDER — ONDANSETRON HCL 4 MG/2ML IJ SOLN
INTRAMUSCULAR | Status: DC | PRN
  Administered 2016-03-01 (×2): 4 mg via INTRAVENOUS

## 2016-03-01 MED ORDER — THROMBIN 20000 UNITS EX SOLR
CUTANEOUS | Status: DC | PRN
  Administered 2016-03-01: 20000 [IU] via TOPICAL

## 2016-03-01 MED ORDER — ONDANSETRON HCL 4 MG/2ML IJ SOLN
INTRAMUSCULAR | Status: AC
  Filled 2016-03-01: qty 2

## 2016-03-01 MED ORDER — HEMOSTATIC AGENTS (NO CHARGE) OPTIME
TOPICAL | Status: DC | PRN
  Administered 2016-03-01: 1 via TOPICAL

## 2016-03-01 MED ORDER — PROPOFOL 10 MG/ML IV BOLUS
INTRAVENOUS | Status: DC | PRN
  Administered 2016-03-01: 50 mg via INTRAVENOUS
  Administered 2016-03-01: 200 mg via INTRAVENOUS

## 2016-03-01 MED ORDER — OXYCODONE HCL 5 MG PO TABS
10.0000 mg | ORAL_TABLET | ORAL | Status: DC | PRN
  Administered 2016-03-01 – 2016-03-03 (×7): 10 mg via ORAL
  Filled 2016-03-01 (×7): qty 2

## 2016-03-01 MED ORDER — MORPHINE SULFATE (PF) 2 MG/ML IV SOLN
1.0000 mg | INTRAVENOUS | Status: DC | PRN
  Administered 2016-03-01: 2 mg via INTRAVENOUS
  Filled 2016-03-01: qty 1

## 2016-03-01 MED ORDER — EPINEPHRINE 0.3 MG/0.3ML IJ DEVI: 0.3000 mg | Freq: Once | INTRAMUSCULAR | Status: DC | PRN

## 2016-03-01 MED ORDER — SODIUM CHLORIDE 0.9% FLUSH
3.0000 mL | Freq: Two times a day (BID) | INTRAVENOUS | Status: DC
  Administered 2016-03-01 – 2016-03-02 (×3): 3 mL via INTRAVENOUS

## 2016-03-01 MED ORDER — MIDAZOLAM HCL 2 MG/2ML IJ SOLN
INTRAMUSCULAR | Status: DC | PRN
  Administered 2016-03-01 (×2): 1 mg via INTRAVENOUS

## 2016-03-01 MED ORDER — METHOCARBAMOL 500 MG PO TABS
500.0000 mg | ORAL_TABLET | Freq: Four times a day (QID) | ORAL | Status: DC | PRN
  Administered 2016-03-01 – 2016-03-03 (×4): 500 mg via ORAL
  Filled 2016-03-01 (×4): qty 1

## 2016-03-01 MED ORDER — SUCCINYLCHOLINE CHLORIDE 20 MG/ML IJ SOLN
INTRAMUSCULAR | Status: DC | PRN
  Administered 2016-03-01: 100 mg via INTRAVENOUS

## 2016-03-01 MED ORDER — FENTANYL CITRATE (PF) 250 MCG/5ML IJ SOLN
INTRAMUSCULAR | Status: AC
  Filled 2016-03-01: qty 5

## 2016-03-01 MED ORDER — FENTANYL CITRATE (PF) 250 MCG/5ML IJ SOLN
INTRAMUSCULAR | Status: DC | PRN
  Administered 2016-03-01: 100 ug via INTRAVENOUS
  Administered 2016-03-01: 150 ug via INTRAVENOUS

## 2016-03-01 MED ORDER — VANCOMYCIN HCL IN DEXTROSE 1-5 GM/200ML-% IV SOLN
1000.0000 mg | Freq: Once | INTRAVENOUS | Status: AC
  Administered 2016-03-01: 1000 mg via INTRAVENOUS
  Filled 2016-03-01: qty 200

## 2016-03-01 MED ORDER — HYDROMORPHONE HCL 1 MG/ML IJ SOLN
INTRAMUSCULAR | Status: DC | PRN
  Administered 2016-03-01 (×2): 0.5 mg via INTRAVENOUS

## 2016-03-01 MED ORDER — LIDOCAINE HCL (CARDIAC) 20 MG/ML IV SOLN
INTRAVENOUS | Status: DC | PRN
  Administered 2016-03-01: 60 mg via INTRATRACHEAL

## 2016-03-01 MED ORDER — ONDANSETRON HCL 4 MG/2ML IJ SOLN
4.0000 mg | INTRAMUSCULAR | Status: DC | PRN
  Administered 2016-03-02: 4 mg via INTRAVENOUS
  Filled 2016-03-01: qty 2

## 2016-03-01 MED ORDER — GLYCOPYRROLATE 0.2 MG/ML IJ SOLN
INTRAMUSCULAR | Status: DC | PRN
  Administered 2016-03-01 (×2): 0.1 mg via INTRAVENOUS

## 2016-03-01 SURGICAL SUPPLY — 77 items
BLADE SURG ROTATE 9660 (MISCELLANEOUS) IMPLANT
BUR EGG ELITE 4.0 (BURR) IMPLANT
BUR EGG ELITE 4.0MM (BURR)
CANISTER SUCT 3000ML (MISCELLANEOUS) ×3 IMPLANT
CLIP NEUROVISION LG (CLIP) ×2 IMPLANT
CLOSURE STERI-STRIP 1/2X4 (GAUZE/BANDAGES/DRESSINGS) ×1
CLSR STERI-STRIP ANTIMIC 1/2X4 (GAUZE/BANDAGES/DRESSINGS) ×2 IMPLANT
COVER SURGICAL LIGHT HANDLE (MISCELLANEOUS) ×3 IMPLANT
DEVICE ENDSKLTN NANOLOCK 10 XL (Neuro Prosthesis/Implant) IMPLANT
DRAPE C-ARM 42X72 X-RAY (DRAPES) ×3 IMPLANT
DRAPE C-ARMOR (DRAPES) ×3 IMPLANT
DRAPE POUCH INSTRU U-SHP 10X18 (DRAPES) ×3 IMPLANT
DRAPE SURG 17X23 STRL (DRAPES) ×3 IMPLANT
DRAPE U-SHAPE 47X51 STRL (DRAPES) ×3 IMPLANT
DRSG AQUACEL AG ADV 3.5X 6 (GAUZE/BANDAGES/DRESSINGS) ×4 IMPLANT
DRSG MEPILEX BORDER 4X8 (GAUZE/BANDAGES/DRESSINGS) ×3 IMPLANT
DURAPREP 26ML APPLICATOR (WOUND CARE) ×3 IMPLANT
ELECT BLADE 4.0 EZ CLEAN MEGAD (MISCELLANEOUS) ×3
ELECT BLADE 6.5 EXT (BLADE) IMPLANT
ELECT PENCIL ROCKER SW 15FT (MISCELLANEOUS) ×3 IMPLANT
ELECT REM PT RETURN 9FT ADLT (ELECTROSURGICAL) ×3
ELECTRODE BLDE 4.0 EZ CLN MEGD (MISCELLANEOUS) ×1 IMPLANT
ELECTRODE REM PT RTRN 9FT ADLT (ELECTROSURGICAL) ×1 IMPLANT
ENDOSKELETON NANOLOCK 10 XL (Neuro Prosthesis/Implant) ×3 IMPLANT
GLOVE BIOGEL PI IND STRL 8.5 (GLOVE) ×1 IMPLANT
GLOVE BIOGEL PI INDICATOR 8.5 (GLOVE) ×2
GLOVE SS BIOGEL STRL SZ 8.5 (GLOVE) ×1 IMPLANT
GLOVE SUPERSENSE BIOGEL SZ 8.5 (GLOVE) ×2
GOWN STRL REUS W/ TWL LRG LVL3 (GOWN DISPOSABLE) ×1 IMPLANT
GOWN STRL REUS W/TWL 2XL LVL3 (GOWN DISPOSABLE) ×6 IMPLANT
GOWN STRL REUS W/TWL LRG LVL3 (GOWN DISPOSABLE) ×3
GUIDEWIRE NITINOL BEVEL TIP (WIRE) ×2 IMPLANT
KIT BASIN OR (CUSTOM PROCEDURE TRAY) ×3 IMPLANT
KIT POSITION SURG JACKSON T1 (MISCELLANEOUS) IMPLANT
KIT ROOM TURNOVER OR (KITS) ×3 IMPLANT
LIGHT SOURCE ANGLE TIP STR 7FT (MISCELLANEOUS) ×2 IMPLANT
MODULE NVM5 NEXT GEN EMG (NEEDLE) ×2 IMPLANT
NDL I-PASS III (NEEDLE) IMPLANT
NDL SPNL 18GX3.5 QUINCKE PK (NEEDLE) ×2 IMPLANT
NEEDLE 22X1 1/2 (OR ONLY) (NEEDLE) ×3 IMPLANT
NEEDLE I-PASS III (NEEDLE) ×3 IMPLANT
NEEDLE SPNL 18GX3.5 QUINCKE PK (NEEDLE) ×6 IMPLANT
NS IRRIG 1000ML POUR BTL (IV SOLUTION) ×3 IMPLANT
PACK LAMINECTOMY ORTHO (CUSTOM PROCEDURE TRAY) ×3 IMPLANT
PACK UNIVERSAL I (CUSTOM PROCEDURE TRAY) ×3 IMPLANT
PAD ARMBOARD 7.5X6 YLW CONV (MISCELLANEOUS) ×6 IMPLANT
PATTIES SURGICAL .5 X.5 (GAUZE/BANDAGES/DRESSINGS) IMPLANT
PATTIES SURGICAL .5 X1 (DISPOSABLE) ×3 IMPLANT
POSITIONER HEAD PRONE TRACH (MISCELLANEOUS) ×3 IMPLANT
PROBE BALL TIP NVM5 SNG USE (BALLOONS) ×2 IMPLANT
PUTTY DBX 1CC (Putty) ×3 IMPLANT
PUTTY DBX 1CC DEPUY (Putty) IMPLANT
REDUCTION EXT RELINE MAS MOD (Neuro Prosthesis/Implant) ×2 IMPLANT
ROD RELINE MAS LORD 5.5X45MM (Rod) ×2 IMPLANT
ROD RELINE MAS LORD 5.5X50 (Rod) ×2 IMPLANT
SCREW LOCK RELINE 5.5 TULIP (Screw) ×8 IMPLANT
SCREW RED RELINE 7.5X45MM POLY (Screw) ×2 IMPLANT
SCREW RELINE RED 6.5X45MM POLY (Screw) ×4 IMPLANT
SCREW SHANK MAS MOD 6.5X50MM (Screw) ×2 IMPLANT
SCREW SHANK RELINE 6.5X45MM 2C (Screw) ×2 IMPLANT
SHEET CONFORM 45LX20WX5H (Bone Implant) ×2 IMPLANT
SPONGE LAP 4X18 X RAY DECT (DISPOSABLE) ×8 IMPLANT
SPONGE SURGIFOAM ABS GEL 100 (HEMOSTASIS) ×3 IMPLANT
SURGIFLO W/THROMBIN 8M KIT (HEMOSTASIS) ×2 IMPLANT
SUT BONE WAX W31G (SUTURE) ×3 IMPLANT
SUT MNCRL AB 3-0 PS2 18 (SUTURE) ×8 IMPLANT
SUT STRATAFIX 1PDS 45CM VIOLET (SUTURE) ×2 IMPLANT
SUT VIC AB 1 CT1 18XCR BRD 8 (SUTURE) ×1 IMPLANT
SUT VIC AB 1 CT1 8-18 (SUTURE) ×6
SUT VIC AB 2-0 CT1 18 (SUTURE) ×5 IMPLANT
SYR BULB IRRIGATION 50ML (SYRINGE) ×3 IMPLANT
SYR CONTROL 10ML LL (SYRINGE) ×3 IMPLANT
TOWEL OR 17X24 6PK STRL BLUE (TOWEL DISPOSABLE) ×3 IMPLANT
TOWEL OR 17X26 10 PK STRL BLUE (TOWEL DISPOSABLE) ×3 IMPLANT
TRAY FOLEY CATH 16FRSI W/METER (SET/KITS/TRAYS/PACK) ×3 IMPLANT
WATER STERILE IRR 1000ML POUR (IV SOLUTION) ×3 IMPLANT
YANKAUER SUCT BULB TIP NO VENT (SUCTIONS) ×3 IMPLANT

## 2016-03-01 NOTE — Brief Op Note (Signed)
03/01/2016  1:43 PM  PATIENT:  [name]  40 y.o. female  PRE-OPERATIVE DIAGNOSIS:  L4 - L5 Slip with Radicular Leg Pain  POST-OPERATIVE DIAGNOSIS:  L4 - L5 Slip with Radicular Leg Pain  PROCEDURE:  Procedure(s): TRANSFORAMINAL LUMBAR INTERBODY FUSION (TLIF) WITH PEDICLE SCREW FIXATION L4 - L5 1 LEVEL, L3-4 DISCECTOMY ON THE LEFT (N/A)  SURGEON:  Surgeon(s) and Role:    * Venita Lickahari Matthew Cina, MD - Primary  PHYSICIAN ASSISTANT:   ASSISTANTS: Carmen Mayo   ANESTHESIA:   general  EBL:  Total I/O In: 2000 [I.V.:2000] Out: 1325 [Urine:1150; Blood:175]  BLOOD ADMINISTERED:none  DRAINS: none   LOCAL MEDICATIONS USED:  MARCAINE     SPECIMEN:  No Specimen  DISPOSITION OF SPECIMEN:  N/A  COUNTS:  YES  TOURNIQUET:  * No tourniquets in log *  DICTATION: .Other Dictation: Dictation Number [id number]  PLAN OF CARE: Admit to inpatient   PATIENT DISPOSITION:  PACU - hemodynamically stable.

## 2016-03-01 NOTE — Anesthesia Preprocedure Evaluation (Addendum)
Anesthesia Evaluation  Patient identified by MRN, date of birth, ID band Patient awake    Reviewed: Allergy & Precautions, H&P , NPO status , Patient's Chart, lab work & pertinent test results  History of Anesthesia Complications Negative for: history of anesthetic complications  Airway Mallampati: II  TM Distance: >3 FB Neck ROM: full    Dental no notable dental hx.    Pulmonary former smoker,    Pulmonary exam normal breath sounds clear to auscultation       Cardiovascular negative cardio ROS Normal cardiovascular exam Rhythm:regular Rate:Normal     Neuro/Psych  Headaches, Anxiety Depression Bipolar Disorder    GI/Hepatic Neg liver ROS, GERD  ,  Endo/Other  negative endocrine ROS  Renal/GU negative Renal ROS     Musculoskeletal  (+) Arthritis , Fibromyalgia -  Abdominal   Peds  Hematology negative hematology ROS (+)   Anesthesia Other Findings   Reproductive/Obstetrics negative OB ROS                            Anesthesia Physical Anesthesia Plan  ASA: III  Anesthesia Plan: General   Post-op Pain Management:    Induction: Intravenous  Airway Management Planned: Oral ETT  Additional Equipment:   Intra-op Plan:   Post-operative Plan:   Informed Consent: I have reviewed the patients History and Physical, chart, labs and discussed the procedure including the risks, benefits and alternatives for the proposed anesthesia with the patient or authorized representative who has indicated his/her understanding and acceptance.   Dental Advisory Given  Plan Discussed with: Anesthesiologist, CRNA and Surgeon  Anesthesia Plan Comments: (Rec multimodal analgesia with acetaminophen, intraop opioid and preincision ketamine)       Anesthesia Quick Evaluation

## 2016-03-01 NOTE — Progress Notes (Signed)
ANTIBIOTIC CONSULT NOTE - INITIAL  Pharmacy Consult for vancomycin Indication: surgical prophylaxis  Allergies  Allergen Reactions  . Bee Venom Anaphylaxis and Hives  . Hornet Venom Anaphylaxis  . Wasp Venom Anaphylaxis  . Yellow Jacket Venom Anaphylaxis  . Amoxicillin Hives    Has patient had a PCN reaction causing immediate rash, facial/tongue/throat swelling, SOB or lightheadedness with hypotension: Yes Has patient had a PCN reaction causing severe rash involving mucus membranes or skin necrosis: No Has patient had a PCN reaction that required hospitalization No Has patient had a PCN reaction occurring within the last 10 years: Yes If all of the above answers are "NO", then may proceed with Cephalosporin use.  . Aspirin Hives  . Erythromycin Hives  . Penicillins Hives    Has patient had a PCN reaction causing immediate rash, facial/tongue/throat swelling, SOB or lightheadedness with hypotension: Yes Has patient had a PCN reaction causing severe rash involving mucus membranes or skin necrosis: No Has patient had a PCN reaction that required hospitalization unknown - childhood reaction Has patient had a PCN reaction occurring within the last 10 years: No If all of the above answers are "NO", then may proceed with Cephalosporin use.  . Prozac [Fluoxetine Hcl] Hives  . Food Hives, Rash and Other (See Comments)    Raw tomatoes. Hives,rash,throat closing.     Patient Measurements:   Adjusted Body Weight:   Vital Signs: Temp: 98.5 F (36.9 C) (06/28 1533) Temp Source: Oral (06/28 0728) BP: 139/96 mmHg (06/28 1525) Pulse Rate: 78 (06/28 1530) Intake/Output from previous day:   Intake/Output from this shift: Total I/O In: 2800 [I.V.:2800] Out: 1825 [Urine:1650; Blood:175]  Labs: No results for input(s): WBC, HGB, PLT, LABCREA, CREATININE in the last 72 hours. Estimated Creatinine Clearance: 111.4 mL/min (by C-G formula based on Cr of 0.56). No results for input(s):  VANCOTROUGH, VANCOPEAK, VANCORANDOM, GENTTROUGH, GENTPEAK, GENTRANDOM, TOBRATROUGH, TOBRAPEAK, TOBRARND, AMIKACINPEAK, AMIKACINTROU, AMIKACIN in the last 72 hours.   Microbiology: Recent Results (from the past 720 hour(s))  Surgical pcr screen     Status: Abnormal   Collection Time: 02/21/16  9:00 AM  Result Value Ref Range Status   MRSA, PCR NEGATIVE NEGATIVE Final   Staphylococcus aureus POSITIVE (A) NEGATIVE Final    Comment:        The Xpert SA Assay (FDA approved for NASAL specimens in patients over 41 years of age), is one component of a comprehensive surveillance program.  Test performance has been validated by [facility] for patients greater than or equal to 41 year old. It is not intended to diagnose infection nor to guide or monitor treatment.     Medical History: Past Medical History  Diagnosis Date  . Migraines   . Bipolar 1 disorder (HCC)   . Neuropathy (HCC)   . Arthritis   . Degenerative disk disease   . Seasonal allergies   . Anxiety   . Depression   . GERD (gastroesophageal reflux disease)   . Fibromyalgia     Medications:  Scheduled:  . dexamethasone  4 mg Intravenous Q6H   Or  . dexamethasone  4 mg Oral Q6H  . DULoxetine  30 mg Oral BID  . HYDROmorphone      . HYDROmorphone      . [START ON 03/02/2016] magnesium citrate  0.5 Bottle Oral Once  . pantoprazole  40 mg Oral Daily  . sodium chloride flush  3 mL Intravenous Q12H  . [START ON 03/02/2016] sodium phosphate  1 enema  Rectal Once   Infusions:  . sodium chloride    . lactated ringers     Assessment: 40 yo female will be put on vancomycin for surgical prophylaxis.  Patient received 1500 mg of vancomycin x1 at 0757.  CrCl > 100. Patient doesn't have a drain.  Goal of Therapy:  Vancomycin trough level 10-15 mcg/ml  Plan:  - Vancomycin 1g iv x1 at 2000. Pharmacy will sign off.   Messiyah Waterson, Tsz-Yin 03/01/2016,3:47 PM

## 2016-03-01 NOTE — Op Note (Signed)
NAMEDONALDA, JOB            ACCOUNT NO.:  [id number]  MEDICAL RECORD NO.:  [id number]  LOCATION:  3C04C                        FACILITY:  [facility]  PHYSICIAN:  Izzak Fries D. Shon Baton, M.D. DATE OF BIRTH:  05-04-1976  DATE OF PROCEDURE:  03/01/2016 DATE OF DISCHARGE:                              OPERATIVE REPORT   PREOPERATIVE DIAGNOSES: 1. Lumbar spinal stenosis with spondylolisthesis, L4-5. 2. 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  POSTOPERATIVE DIAGNOSES: 1. Lumbar spinal stenosis with spondylolisthesis, L4-5. 2. 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  OPERATIVE PROCEDURES: 1. L3-4 left hemilaminotomy and facetectomy for lateral recess     decompression. 2. Transforaminal lumbar interbody fusion, L4-5.  IMPLANT SYSTEM USED:  Titan nanoLOCK size 10 extra long intervertebral cage packed with autograft bone and DBX with NuVasive pedicle screws placed at L4 and L5.  COMPLICATIONS:  None.  CONDITION:  Stable.  FIRST ASSISTANT:  Anette Riedel, my PA.  HISTORY:  This is a very pleasant 40 year old woman, who 6 years ago underwent a left side L5-S1.  She was doing well until recently when she started having progressive back, buttock, and increasing left radicular neuropathic leg pain.  Final MRI and imaging studies demonstrated disk protrusion and lateral recess stenosis at L3-4 and degenerative spondylolisthesis at L4-5 with adjacent segment disease.  Having failed conservative management, we elected to proceed with surgery.  All appropriate risks, benefits, and alternatives were discussed with the patient and consent was obtained.  OPERATIVE NOTE:  The patient was brought to the operating room and placed supine on the operating table.  After successful induction of general anesthesia and endotracheal intubation, TEDs, SCDs were applied as was  a Foley.  Neuromonitoring needles were then placed and the patient was turned prone onto the Wilson frame.  All bony prominences were well padded.  The back was prepped and draped in a standard fashion.  Time-out was taken confirming patient, procedure, and all other pertinent important data.  Once it was done, I then on the right- hand side, identified the lateral border of the L4 and L5 pedicle and marked the skin.  Skin was then infiltrated with 0.25% Marcaine.  Stab incisions were made.  I advanced Jamshidi needle down percutaneously to the lateral border of the facet complex.  I then confirmed position and trajectory and using fluoro as well as the real-time neuromonitoring, advanced the Jamshidi needle down to the nearing the medial wall of the pedicle based on the AP view.  I then switched to the lateral view and confirmed that I was just beyond the posterior vertebral wall on the lateral view, with no evidence of pedicle breach by EMG.  I then advanced the Jamshidi needle into the vertebral body.  I then placed my guidepin and then repeated the exact same procedure at L5.  Once both pedicles were cannulated, I then tapped and then placed a 40 mm screw at L5 and a 45 mm length screw at L4.  Both screws had excellent purchase. Final x-rays demonstrated satisfactory position and trajectory of the screws. I then went  to the contralateral side.  This was the symptomatic side. I then again identified the lateral walls of the pedicle and at this time, infiltrated the incision site and I then made incision.  I performed a standard Wiltse approach to the spine.  I dissected down through the deep subcutaneous tissue to deep fascia.  Deep fascia was sharply incised and then I bluntly dissected through the paraspinal tissue down to the lateral aspect of the facet.  Using the same technique I had used on the right, I placed my Jamshidi needle and advanced it through the pedicle into the  vertebral body.  I did utilize AP and lateral fluoro for this as well as neuromonitoring.  Once both pedicles were cannulated, I tapped and placed screws.  Attached to the screws where the retracting devices.  I assembled my retractor and exposed the posterolateral aspect of the L4-5 level.  There was significant facet overgrowth and obvious degenerative disk disease.  I identified the L4 lamina and the L4-5 facet complex and the L4 pars as well as the L3-4 facet.  Care was taken not to violate the 3-4 capsule. Using an osteotome, I removed the bulk of the inferior L4 facet.  I then used a 2 and 3 mm Kerrison punch to perform a generous laminotomy of L4 and resect the pars.  Once this was completed, I dissected through the ligamentum flavum with Penfield 4.  I then used my Kerrison punches to resect the ligamentum flavum and expose the posterolateral aspect of the thecal sac and the L5 traversing nerve root.  I then removed the medial wall of the medial osteophyte from the medial aspect of the L5 superior facet and palpated and visualized the medial wall of the pedicle.  I identified the L5 nerve root and then using my 3 mm Kerrison, performed a generous foraminotomy.  Once this was done, I used a neural patty to retract and protect the L5 nerve root.  Once this was done, I was able to trial superiorly, identified the disk space, and identified the L4 nerve root.  I removed the thickened ligamentum flavum and osteophyte and completely decompress the L4 nerve root.  With this completed, I then placed a neural patty to protect the L4 nerve root and retracted the thecal sac.  An annulotomy was performed with 15 blade scalpel, and using a combination of various side-cutting Kerrison rongeurs, curettes, and pituitary rongeurs, I removed the bulk of the disk material from the intervertebral space.  Using side cutting curettes, I made sure I was scraping along the subchondral bone and I removed  the cartilaginous endplate.  Once I confirmed this, I then irrigated the wound copiously with normal saline.  I trialed the intervertebral spaces and elected to use a size 10 extra long Titan nanoLOCK cage.  I obtained the cage, packed it with bone graft that I had harvested from the decompression and then placed a little bit of DBX.  Prior to placing this, I did take my Conform allograft sheet and placed it along the anterior annulus and then placed the implant.  This was malleted into the appropriate position.  It was placed horizontally in the anterior third of the disk space.  I confirmed satisfactory position in the AP and lateral planes. Cage shelf was across the midline and well centered.  At this point in time, I then repositioned my retractor to close the L3 lamina.  Using a 3 mm Kerrison rongeur, I performed a laminotomy of  L3 and then used my Penfield 4 to dissect through the ligamentum flavum. Using a 2 mm Kerrison, I resected the ligamentum flavum.  I then continued my decompression into the lateral recess.  Medial facetectomy was performed and I could now palpate and visualize the disk space of L3- 4.  I did not see a large frank disk herniation or significant annular defect.  At this point, I elected not to perform an annulotomy and try and remove the small fragment of disk as this would only accelerate the degenerative process and increase the likelihood that she would develop an adjacent segment disease that would require further surgery. Instead, I made sure I had adequate lateral recess decompression and that the exiting and traversing nerve roots were decompressed.  Once this was completed, I irrigated the wound copiously with normal saline. I then made sure hemostasis using bipolar electrocautery and FloSeal. Polyaxial heads were attached to the screws on the left-hand side.  The kyphosis was removed from the bed and then I measured and placed appropriate size rods  and top locking nuts.  All 4 locking nuts were torqued according to manufacture's standards.  Retractors were removed.  The wound was irrigated again and once I confirmed hemostasis, I placed a thrombin-soaked Gelfoam patty over the exposed laminotomy sites.  I then closed the deep fascia of both wounds with interrupted #1 Vicryl sutures, superficial 2-0 Vicryl sutures, and a 3-0 Monocryl for the skin.  Steri-Strips and dry dressings were applied, and the patient was ultimately extubated and transferred to the PACU without incident.  At the end of the case, all needle and sponge counts were correct.  There were no adverse intraoperative events.     Logen Heintzelman D. Shon BatonBrooks, M.D.     DDB/MEDQ  D:  03/01/2016  T:  03/01/2016  Job:  [id number]

## 2016-03-01 NOTE — Discharge Instructions (Signed)
Do not remove dressing °Ok to shower in 5 days ° °

## 2016-03-01 NOTE — Anesthesia Procedure Notes (Signed)
Procedure Name: Intubation Date/Time: 03/01/2016 8:40 AM Performed by: Salomon MastWALL, Lizandro Spellman COREY Pre-anesthesia Checklist: Patient identified, Emergency Drugs available, Suction available and Patient being monitored Patient Re-evaluated:Patient Re-evaluated prior to inductionOxygen Delivery Method: Circle system utilized Preoxygenation: Pre-oxygenation with 100% oxygen Intubation Type: IV induction Laryngoscope Size: Mac and 3 Grade View: Grade I Tube type: Oral Number of attempts: 1 Airway Equipment and Method: Stylet Placement Confirmation: ETT inserted through vocal cords under direct vision,  positive ETCO2 and breath sounds checked- equal and bilateral Secured at: 22 cm Tube secured with: Tape Dental Injury: Teeth and Oropharynx as per pre-operative assessment

## 2016-03-01 NOTE — Transfer of Care (Signed)
Immediate Anesthesia Transfer of Care Note  Patient: [name]  Procedure(s) Performed: Procedure(s): TRANSFORAMINAL LUMBAR INTERBODY FUSION (TLIF) WITH PEDICLE SCREW FIXATION L4 - L5 1 LEVEL, L3-4 DISCECTOMY ON THE LEFT (N/A)  Patient Location: PACU  Anesthesia Type:General  Level of Consciousness: awake, alert , oriented and patient cooperative  Airway & Oxygen Therapy: Patient Spontanous Breathing and Patient connected to face mask oxygen  Post-op Assessment: Report given to RN, Post -op Vital signs reviewed and stable, Patient moving all extremities X 4 and Patient able to stick tongue midline  Post vital signs: Reviewed and stable  Last Vitals:  Filed Vitals:   03/01/16 0728 03/01/16 1407  BP: 131/81   Pulse: 76   Temp: 36.7 C 37.3 C  Resp: 18     Last Pain:  Filed Vitals:   03/01/16 1410  PainSc: 9       Patients Stated Pain Goal: 1 (03/01/16 0728)  Complications: No apparent anesthesia complications

## 2016-03-01 NOTE — H&P (Signed)
History of Present Illness  The patient is a 40 year old female who comes in today for a preoperative History and Physical. The patient is scheduled for a TLIF L4-5 to be performed by Dr. Debria Garretahari D. Shon BatonBrooks, [facility] on 03-01-16 . Please see the hospital record for complete dictated history and physical. Note for "H & P": The patient has had an updated lumbar MRI @ [facility] 02-17-16.  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 father Heart Disease  father Congestive Heart Failure  father and grandmother mothers side Cancer  mother and grandfather mothers side Diabetes Mellitus  father Osteoporosis  grandmother mothers side Osteoarthritis  mother and grandmother mothers side Depression  mother and brother Drug / Alcohol Addiction  father and grandfather mothers side  Medication History  Percocet (10-325MG  Tablet, Oral) Active. (q 6 hr Rx'd by Dr Jeannetta NapElkins) Multivitamins (Oral) Active. (qd) Vitamin D (2000UNIT Tablet, Oral) Active. (qd) Vitamin B12 (Oral) Specific strength unknown - Active. (qd) Vitamin C (Oral) Specific strength unknown - Active.  (qd) Cymbalta (30MG  Capsule DR Part, Oral) Active. (bid) Omeprazole (40MG  Capsule DR, Oral) Active. (qd) EpiPen (0.3MG /0.3ML Device, Intramuscular as needed) Active. (BEES RAW TOMATOES CATS) Medications Reconciled  Past Surgical History Spinal Fusion  lower back Foot Surgery  bilateral Spinal Surgery  Carpal Tunnel Repair  right Tubal Ligation  Hysterectomy  complete (non-cancerous)  Other Problems  Anxiety Disorder  Chronic Pain  Fibromyalgia  Gastroesophageal Reflux Disease  Migraine Headache  Oophorectomy  right  Vitals  02/18/2016 9:10 AM Weight: 222.03 lb Height: 65.5in Body Surface Area: 2.08 m Body Mass Index: 36.39 kg/m  Temp.: 98.108F(Oral)  Pulse: 89 (Regular)  BP: 109/78 (Sitting, Right Arm, Standard)  General General Appearance-Not in acute distress. Orientation-Oriented X3. Build & Nutrition-Well nourished and Well developed.  Integumentary General Characteristics Surgical Scars - no surgical scar evidence of previous lumbar surgery. Lumbar Spine-Skin examination of the lumbar spine is without deformity, skin lesions, lacerations or abrasions.  Chest and Lung Exam Auscultation Breath sounds - Normal and Clear.  Cardiovascular Auscultation Rhythm - Regular rate and rhythm.  Abdomen Palpation/Percussion Palpation and Percussion of the abdomen reveal - Soft, Non Tender and No Rebound tenderness.  Peripheral Vascular Lower Extremity Palpation - Posterior tibial pulse - Bilateral - 2+. Dorsalis pedis pulse - Bilateral - 2+.  Neurologic Sensation Lower Extremity - Left - sensation is diminished in the lower extremity (L4 and L5 distribution). Right - sensation is intact in the lower extremity. Reflexes Patellar Reflex - Bilateral - 2+. Achilles Reflex - Bilateral - 2+. Clonus - Bilateral - clonus not present. Hoffman's Sign - Bilateral - Hoffman's sign not present. Testing Seated straight leg raise positive left  .  Musculoskeletal Spine/Ribs/Pelvis  Lumbosacral Spine: Inspection and Palpation - Tenderness - left lumbar paraspinals tender  to palpation and right lumbar paraspinals tender to palpation. Strength and Tone: Strength - Hip Flexion - Bilateral - 5/5. Knee Extension - Bilateral - 5/5. Knee Flexion - Bilateral - 5/5. Ankle Dorsiflexion - Bilateral - 5/5. Ankle Plantarflexion - Bilateral - 5/5. Heel walk - Bilateral - able to heel walk without difficulty. Toe Walk - Bilateral - able to walk on toes without difficulty. Heel-Toe Walk - Bilateral - able to heel-toe walk without difficulty. ROM - Flexion - moderately decreased range of motion and painful. Extension - moderately decreased range of motion and painful. Left Lateral Bending - moderately decreased range of motion and painful. Right Lateral Bending - moderately decreased range of motion and painful. Right Rotation - moderately decreased range of motion and painful. Left Rotation - moderately decreased range of motion and painful. Pain - . Lumbosacral Spine - Waddell's Signs - no Waddell's signs present. Lower Extremity Range of Motion - No true hip, knee or ankle pain with range of motion. Gait and Station - [employer] - no assistive devices.  I reviewed the MRI with Sible. In addition to the grade I slip at 4-5 and the degenerative disc disease at 4-5, she now has a increased left L3-4 disc herniation with mass effect on the exiting L3 nerve root. Given this and her left leg symptoms, I think it is reasonable in addition to doing 4-5 fusion to also do the discectomy at 3-4. We reviewed the risks, benefits, and alternatives to surgery. I have explained the MRI to her and we will move forward with the TLIF at 4-5 and the discectomy at 3-4 on the left.   Assessment & Plan   Posterior decompression/Fusion:Risks of surgery include infection, bleeding, nerve damage, death, stroke, paralysis, failure to heal, need for further surgery, ongoing or  worse pain, need for further surgery, CSF leak, loss of bowel or bladder, and recurrent disc herniation or stenosis which would necessitate need for further surgery. Non-union, hardware failure, adjacent segment disease and recurrent pain. Hardware breakage, mal-position requiring surgery to correct or remove.  Goal Of Surgery: Discussed that goal of surgery is to reduce pain and improve function and quality of life. Patient is aware that despite all appropriate treatment that there pain and function could be the same, worse, or different.  Note:pt is under pain management, had last script of Percocet on 6/7.

## 2016-03-01 NOTE — Progress Notes (Signed)
Orthopedic Tech Progress Note Patient Details:  [name] 10/11/1975 [id number] Brace order completed by [employer] vendor. Patient ID: [name], female   DOB: 07/02/1976, 40 y.o.   MRN: [id number]   Jennye MoccasinHughes, Saryah Loper Craig 03/01/2016, 8:05 PM

## 2016-03-01 NOTE — Progress Notes (Signed)
Orthopedic Tech Progress Note Patient Details:  [name] 06/27/1976 [id number] Called brace order in to [employer]. Patient ID: [name], female   DOB: 03/02/1976, 40 y.o.   MRN: [id number]   Jennye MoccasinHughes, Edmund Rick Craig 03/01/2016, 3:48 PM

## 2016-03-02 ENCOUNTER — Inpatient Hospital Stay (HOSPITAL_COMMUNITY): Payer: Medicaid Other

## 2016-03-02 NOTE — Progress Notes (Signed)
Occupational Therapy Evaluation Patient Details Name: [name] MRN: [id number] DOB: 12/10/1975 Today's Date: 03/02/2016    History of Present Illness s/p TLIF L4-5   Clinical Impression   PTA, pt used a cane for ambulation. Mod I with ADL although LB were difficult to complete. Will return prior to D/C to educate on use of AE to assist with LB ADL and issue AE as needed. Pt needs 3 in1  for safe D/C. Pt safe to D/C home with intermittent S when medically stable.     Follow Up Recommendations  No OT follow up;Supervision - Intermittent    Equipment Recommendations  3 in 1 bedside comode    Recommendations for Other Services       Precautions / Restrictions Precautions Precautions: Fall;Back Precaution Booklet Issued: Yes (comment) Required Braces or Orthoses: Spinal Brace      Mobility Bed Mobility Overal bed mobility: Modified Independent             General bed mobility comments: good technique  Transfers Overall transfer level: Needs assistance Equipment used: Rolling walker (2 wheeled) Transfers: Sit to/from Stand Sit to Stand: Supervision         General transfer comment: vc to not twist.     Balance Overall balance assessment: Needs assistance   Sitting balance-Leahy Scale: Normal       Standing balance-Leahy Scale: Fair                              ADL Overall ADL's : Needs assistance/impaired     Grooming: Set up;Sitting Grooming Details (indicate cue type and reason): education on techniques Upper Body Bathing: Set up;Sitting   Lower Body Bathing: Moderate assistance;Sit to/from stand   Upper Body Dressing : Set up;Sitting   Lower Body Dressing: Moderate assistance;Sit to/from stand   Toilet Transfer: Supervision/safety;RW;BSC   Toileting- ArchitectClothing Manipulation and Hygiene: Nurse, learning disabilityupervision/safety Toileting - Clothing Manipulation Details (indicate cue type and reason): educated on technique Tub/ Engineer, structuralhower Transfer:  Insurance risk surveyorMin guard Tub/Shower Transfer Details (indicate cue type and reason): Educated on need to use 3 in 1 in tub and not step over until she was strong enough to stand on 1 leg. Educated on need to turn 3 in1 facing out of tub if needed Functional mobility during ADLs: Supervision/safety;Rolling walker General ADL Comments: Educatead on back precautions for LB ADL . Will benefit from adaptive equipment to assist with LB ADL. Pt independent with donning/doffing brace. Handout reviewewd     Vision     Perception     Praxis      Pertinent Vitals/Pain Pain Assessment: 0-10 Pain Score: 10-Worst pain ever Pain Location: back Pain Descriptors / Indicators: Aching;Constant;Spasm Pain Intervention(s): Limited activity within patient's tolerance;Repositioned;Ice applied;Patient requesting pain meds-RN notified     Hand Dominance Right   Extremity/Trunk Assessment Upper Extremity Assessment Upper Extremity Assessment: Overall WFL for tasks assessed   Lower Extremity Assessment Lower Extremity Assessment: Defer to PT evaluation   Cervical / Trunk Assessment Cervical / Trunk Assessment: Other exceptions (back surgery)   Communication Communication Communication: No difficulties   Cognition Arousal/Alertness: Awake/alert Behavior During Therapy: WFL for tasks assessed/performed Overall Cognitive Status: Within Functional Limits for tasks assessed                     General Comments       Exercises       Shoulder Instructions      Home  Living Family/patient expects to be discharged to:: Private residence Living Arrangements: Children Available Help at Discharge: Family;Available 24 hours/day Type of Home: House Home Access: Stairs to enter Entergy CorporationEntrance Stairs-Number of Steps: 3 Entrance Stairs-Rails: Right Home Layout: One level     Bathroom Shower/Tub: Tub/shower unit;Curtain Shower/tub characteristics: Engineer, building servicesCurtain Bathroom Toilet: Standard Bathroom Accessibility:  Yes How Accessible: Accessible via walker Home Equipment: Cane - single point          Prior Functioning/Environment Level of Independence: Independent with assistive device(s) (used cane)        Comments: states LB ADL were difficult due to pain    OT Diagnosis: Generalized weakness;Acute pain   OT Problem List: Decreased strength;Decreased activity tolerance;Decreased knowledge of use of DME or AE;Decreased knowledge of precautions;Pain   OT Treatment/Interventions: Self-care/ADL training;DME and/or AE instruction;Therapeutic activities;Patient/family education    OT Goals(Current goals can be found in the care plan section) Acute Rehab OT Goals Patient Stated Goal: to not be in pain OT Goal Formulation: With patient Time For Goal Achievement: 03/09/16 Potential to Achieve Goals: Good ADL Goals Pt Will Perform Lower Body Bathing: with supervision;with set-up;with adaptive equipment;with caregiver independent in assisting;sit to/from stand Pt Will Perform Lower Body Dressing: with set-up;with supervision;with adaptive equipment;with caregiver independent in assisting;sit to/from stand Additional ADL Goal #1: Pt will independently verbalize 3/3 back precautions  OT Frequency: Min 2X/week   Barriers to D/C:            Co-evaluation              End of Session Equipment Utilized During Treatment: Rolling walker;Back brace Nurse Communication: Mobility status;Patient requests pain meds  Activity Tolerance: Patient tolerated treatment well Patient left: in bed;with call bell/phone within reach;with family/visitor present   Time: [id number] OT Time Calculation (min): 16 min Charges:  OT General Charges $OT Visit: 1 Procedure OT Evaluation $OT Eval Low Complexity: 1 Procedure G-Codes:    Rhona Fusilier,HILLARY 03/02/2016, 9:27 AM  Luisa DagoHilary Taelon Bendorf, OTR/L  [phone] 03/02/2016

## 2016-03-02 NOTE — Progress Notes (Signed)
Patient ID: [name], female   DOB: 10/03/1975, 40 y.o.   MRN: [id number]    Subjective: 1 Day Post-Op Procedure(s) (LRB): TRANSFORAMINAL LUMBAR INTERBODY FUSION (TLIF) WITH PEDICLE SCREW FIXATION L4 - L5 1 LEVEL, L3-4 DISCECTOMY ON THE LEFT (N/A) Patient reports pain as 5 on 0-10 scale.   Denies CP or SOB.  Voiding without difficulty. Positive flatus. Objective: Vital signs in last 24 hours: Temp:  [98.1 F (36.7 C)-101.2 F (38.4 C)] 101.2 F (38.4 C) (06/29 0400) Pulse Rate:  [76-101] 90 (06/29 0400) Resp:  [12-20] 18 (06/29 0400) BP: (117-156)/(54-124) 151/54 mmHg (06/29 0400) SpO2:  [89 %-99 %] 95 % (06/29 0400)  Intake/Output from previous day: 06/28 0701 - 06/29 0700 In: 2800 [I.V.:2800] Out: 5425 [Urine:5250; Blood:175] Intake/Output this shift:    Labs: No results for input(s): HGB in the last 72 hours. No results for input(s): WBC, RBC, HCT, PLT in the last 72 hours. No results for input(s): NA, K, CL, CO2, BUN, CREATININE, GLUCOSE, CALCIUM in the last 72 hours. No results for input(s): LABPT, INR in the last 72 hours.  Physical Exam: Neurologically intact ABD soft Sensation intact distally Dorsiflexion/Plantar flexion intact Incision: no drainage Compartment soft  Assessment/Plan: 1 Day Post-Op Procedure(s) (LRB): TRANSFORAMINAL LUMBAR INTERBODY FUSION (TLIF) WITH PEDICLE SCREW FIXATION L4 - L5 1 LEVEL, L3-4 DISCECTOMY ON THE LEFT (N/A) Advance diet Up with therapy  [name], [name] for Dr. Venita Lickahari [facility] [phone] 03/02/2016, 7:24 AM

## 2016-03-02 NOTE — Progress Notes (Signed)
Physical Therapy Treatment Patient Details Name: [name] MRN: [id number] DOB: 01/07/1976 Today's Date: 03/02/2016    History of Present Illness s/p TLIF L4-5    PT Comments    Patient seen for evaluation and education s/p spinal surgery. Patient mobilizing well with use of RW. Ambulated increased distance and performed stair negotiation. Good recall of precautions. No further acute PT needs, will sign off.   Follow Up Recommendations  No PT follow up     Equipment Recommendations  Rolling walker with 5" wheels    Recommendations for Other Services       Precautions / Restrictions Precautions Precautions: Fall;Back Precaution Booklet Issued: Yes (comment) Required Braces or Orthoses: Spinal Brace    Mobility  Bed Mobility Overal bed mobility: Modified Independent             General bed mobility comments: good technique  Transfers Overall transfer level: Needs assistance Equipment used: Rolling walker (2 wheeled) Transfers: Sit to/from Stand Sit to Stand: Supervision         General transfer comment: vc to not twist.   Ambulation/Gait Ambulation/Gait assistance: Supervision   Assistive device: Rolling walker (2 wheeled) Gait Pattern/deviations: Step-through pattern;Decreased stride length Gait velocity: decreased Gait velocity interpretation: Below normal speed for age/gender General Gait Details: steady but cautious with gait, cues for increased gait speed   Stairs Stairs: Yes Stairs assistance: Min assist Stair Management: Forwards Number of Stairs: 4 General stair comments: min assist for stability no rails  Wheelchair Mobility    Modified Rankin (Stroke Patients Only)       Balance     Sitting balance-Leahy Scale: Normal       Standing balance-Leahy Scale: Fair                      Cognition Arousal/Alertness: Awake/alert Behavior During Therapy: WFL for tasks assessed/performed Overall Cognitive Status:  Within Functional Limits for tasks assessed                      Exercises      General Comments        Pertinent Vitals/Pain Pain Assessment: 0-10 Pain Score: 7  Pain Location: back  Pain Descriptors / Indicators: Aching Pain Intervention(s): Monitored during session    Home Living Family/patient expects to be discharged to:: Private residence Living Arrangements: Children Available Help at Discharge: Family;Available 24 hours/day Type of Home: House Home Access: Stairs to enter Entrance Stairs-Rails: Right Home Layout: One level Home Equipment: Cane - single point      Prior Function Level of Independence: Independent with assistive device(s) (used cane)      Comments: states LB ADL were difficult due to pain   PT Goals (current goals can now be found in the care plan section) Acute Rehab PT Goals Patient Stated Goal: to not be in pain PT Goal Formulation: All assessment and education complete, DC therapy    Frequency       PT Plan      Co-evaluation             End of Session Equipment Utilized During Treatment: Gait belt;Back brace Activity Tolerance: Patient tolerated treatment well Patient left: in bed;with call bell/phone within reach     Time: 1422-1441 PT Time Calculation (min) (ACUTE ONLY): 19 min  Charges:                       G Codes:  Fabio AsaWerner, Raeya Merritts J 03/02/2016, 5:54 PM Charlotte Crumbevon Fiorela Pelzer, PT DPT  [phone]

## 2016-03-03 ENCOUNTER — Encounter (HOSPITAL_COMMUNITY): Payer: Self-pay | Admitting: Orthopedic Surgery

## 2016-03-03 NOTE — Progress Notes (Signed)
Pt doing well. Pt and daughters given D/C instructions with Rx's, verbal understanding was provided. Pt's incision is clean and dry with no sign of infection. Pt's IV was removed prior to D/C. Pt D/C'd home via wheelchair @ 0915 per MD order. Pt is stable @ D/C and has no other needs at this time. Rema FendtAshley Javoni Lucken, RN

## 2016-03-03 NOTE — Progress Notes (Signed)
    Subjective: Procedure(s) (LRB): TRANSFORAMINAL LUMBAR INTERBODY FUSION (TLIF) WITH PEDICLE SCREW FIXATION L4 - L5 1 LEVEL, L3-4 DISCECTOMY ON THE LEFT (N/A) 2 Days Post-Op  Patient reports pain as 3 on 0-10 scale.  Reports decreased leg pain reports incisional back pain   Positive void Positive bowel movement Positive flatus Negative chest pain or shortness of breath  Objective: Vital signs in last 24 hours: Temp:  [98.5 F (36.9 C)-99.8 F (37.7 C)] 98.8 F (37.1 C) (06/30 0417) Pulse Rate:  [67-83] 78 (06/30 0417) Resp:  [18-20] 18 (06/30 0417) BP: (117-157)/(57-71) 128/66 mmHg (06/30 0417) SpO2:  [94 %-100 %] 94 % (06/30 0417)  Intake/Output from previous day:    Labs: No results for input(s): WBC, RBC, HCT, PLT in the last 72 hours. No results for input(s): NA, K, CL, CO2, BUN, CREATININE, GLUCOSE, CALCIUM in the last 72 hours. No results for input(s): LABPT, INR in the last 72 hours.  Physical Exam: Neurologically intact ABD soft Intact pulses distally Incision: dressing C/D/I and no drainage Compartment soft  Assessment/Plan: Patient stable  xrays satisfactory   Continue mobilization with physical therapy Continue care  Advance diet Up with therapy  Doing well - ok for d/c to home  Venita Lickahari Suleika Donavan, MD [facility] [phone]

## 2016-03-10 NOTE — Anesthesia Postprocedure Evaluation (Signed)
Anesthesia Post Note  Patient: [name]  Procedure(s) Performed: Procedure(s) (LRB): TRANSFORAMINAL LUMBAR INTERBODY FUSION (TLIF) WITH PEDICLE SCREW FIXATION L4 - L5 1 LEVEL, L3-4 DISCECTOMY ON THE LEFT (N/A)  Patient location during evaluation: PACU Anesthesia Type: General Level of consciousness: awake and alert Pain management: pain level controlled Vital Signs Assessment: post-procedure vital signs reviewed and stable Respiratory status: spontaneous breathing, nonlabored ventilation, respiratory function stable and patient connected to nasal cannula oxygen Cardiovascular status: blood pressure returned to baseline and stable Postop Assessment: no signs of nausea or vomiting Anesthetic complications: no    Last Vitals:  Filed Vitals:   03/03/16 0417 03/03/16 0801  BP: 128/66 125/76  Pulse: 78 97  Temp: 37.1 C 36.7 C  Resp: 18 18    Last Pain:  Filed Vitals:   03/03/16 0946  PainSc: 6                  Reino KentJudd, Anyelo Mccue J

## 2016-03-15 NOTE — Discharge Summary (Signed)
Physician Discharge Summary  Patient ID: [name] MRN: [id number] DOB/AGE: Apr 28, 1976 40 y.o.  Admit date: 03/01/2016 Discharge date: 03/15/2016  Admission Diagnoses:  Lumbar DDD  Discharge Diagnoses:  Active Problems:   Back pain   Past Medical History  Diagnosis Date  . Migraines   . Bipolar 1 disorder (HCC)   . Neuropathy (HCC)   . Arthritis   . Degenerative disk disease   . Seasonal allergies   . Anxiety   . Depression   . GERD (gastroesophageal reflux disease)   . Fibromyalgia     Surgeries: Procedure(s): TRANSFORAMINAL LUMBAR INTERBODY FUSION (TLIF) WITH PEDICLE SCREW FIXATION L4 - L5 1 LEVEL, L3-4 DISCECTOMY ON THE LEFT on 03/01/2016   Consultants (if any):    Discharged Condition: Improved  Hospital Course: [name] is an 40 y.o. female who was admitted 03/01/2016 with a diagnosis of Lumbar DDD and went to the operating room on 03/01/2016 and underwent the above named procedures. Pt discharged on  03/03/16.  She was given perioperative antibiotics:  Anti-infectives    Start     Dose/Rate Route Frequency Ordered Stop   03/01/16 2000  vancomycin (VANCOCIN) IVPB 1000 mg/200 mL premix     1,000 mg 200 mL/hr over 60 Minutes Intravenous  Once 03/01/16 1548 03/01/16 2017   03/01/16 0600  vancomycin (VANCOCIN) 1,500 mg in sodium chloride 0.9 % 500 mL IVPB     1,500 mg 250 mL/hr over 120 Minutes Intravenous 120 min pre-op 02/29/16 0915 03/01/16 0757    .  She was given sequential compression devices, early ambulation, and TED for DVT prophylaxis.  She benefited maximally from the hospital stay and there were no complications.    Recent vital signs:  Filed Vitals:   03/03/16 0417 03/03/16 0801  BP: 128/66 125/76  Pulse: 78 97  Temp: 98.8 F (37.1 C) 98.1 F (36.7 C)  Resp: 18 18    Recent laboratory studies:  Lab Results  Component Value Date   HGB 12.9 02/21/2016   HGB 14.1 01/03/2011   HGB 14.6 07/22/2009   Lab Results  Component  Value Date   WBC 9.6 02/21/2016   PLT 275 02/21/2016   No results found for: INR Lab Results  Component Value Date   NA 135 02/21/2016   K 4.1 02/21/2016   CL 105 02/21/2016   CO2 23 02/21/2016   BUN 5* 02/21/2016   CREATININE 0.56 02/21/2016   GLUCOSE 108* 02/21/2016    Discharge Medications:     Medication List    STOP taking these medications        ICY HOT EX     propranolol ER 60 MG 24 hr capsule  Commonly known as:  INDERAL LA     SUMAtriptan 50 MG tablet  Commonly known as:  IMITREX     topiramate 100 MG tablet  Commonly known as:  TOPAMAX     traMADol 50 MG tablet  Commonly known as:  ULTRAM      TAKE these medications        albuterol 108 (90 Base) MCG/ACT inhaler  Commonly known as:  PROVENTIL HFA;VENTOLIN HFA  Inhale 2 puffs into the lungs every 6 (six) hours as needed for wheezing or shortness of breath.     CALCIUM PO  Take 1 tablet by mouth daily.     diphenhydrAMINE 25 MG tablet  Commonly known as:  BENADRYL  Take 50 mg by mouth daily as needed (severe allergic reaction).  DULoxetine 30 MG capsule  Commonly known as:  CYMBALTA  Take 30 mg by mouth 2 (two) times daily.     EPINEPHrine 0.3 mg/0.3 mL Devi  Commonly known as:  EPI-PEN  Inject 0.3 mg into the muscle once as needed (severe allergic reactions).     esomeprazole 40 MG capsule  Commonly known as:  NEXIUM  Take 40 mg by mouth daily before breakfast.     methocarbamol 500 MG tablet  Commonly known as:  ROBAXIN  Take 1 tablet (500 mg total) by mouth 3 (three) times daily as needed for muscle spasms.     multivitamin with minerals Tabs tablet  Take 1 tablet by mouth daily.     ondansetron 4 MG tablet  Commonly known as:  ZOFRAN  Take 1 tablet (4 mg total) by mouth every 8 (eight) hours as needed for nausea or vomiting.     oxyCODONE-acetaminophen 10-325 MG tablet  Commonly known as:  PERCOCET  Take 1 tablet by mouth every 4 (four) hours as needed for pain.     VITAMIN  D PO  Take 1 tablet by mouth daily.        Diagnostic Studies: Dg Lumbar Spine 2-3 Views  03/02/2016  CLINICAL DATA:  Postoperative pain ; patient underwent PLIF at L4-L5 on March 01, 2016 ; patient undergone previous anterior fusion at L5-S1. EXAM: LUMBAR SPINE - 2-3 VIEW COMPARISON:  Fluoro spot images from March 01, 2016 ; lumbar spine images from an abdominal and pelvic CT scan of August 07, 2015. FINDINGS: The metallic hardware appears intact and in stable position. There is no spondylolisthesis. There is disc space narrowing at L3-4. There is no compression fracture. IMPRESSION: No acute abnormality of the metallic hardware or the fused levels of L4 and L5. There is chronic disc space narrowing at L3-4 Electronically Signed   By: David  [place] M.D.   On: 03/02/2016 08:48   Dg Lumbar Spine 2-3 Views  03/01/2016  CLINICAL DATA:  L4-5 fusion. EXAM: DG C-ARM GT 120 MIN; LUMBAR SPINE - 2-3 VIEW FLUOROSCOPY TIME:  Radiation Exposure Index (as provided by the fluoroscopic device): If the device does not provide the exposure index: Fluoroscopy Time (in minutes and seconds): 4 minutes and 34 seconds. Number of Acquired Images:  Two fluoro images. COMPARISON:  Abdominal pelvic CT 08/07/2015. FINDINGS: Spot frontal and lateral fluoroscopic images of the lower lumbar spine are submitted. Patient is status post previous anterior fusion at L5-S1. Patient has undergone interval TLIF at L4-5 with bilateral pedicle screws, interconnecting rods and an interbody spacer. The hardware is well positioned. No complications are identified. IMPRESSION: No complications status post interval L4-5 TLIF. Electronically Signed   By: Carey Bullocks M.D.   On: 03/01/2016 14:08   Dg C-arm Gt 120 Min  03/01/2016  CLINICAL DATA:  L4-5 fusion. EXAM: DG C-ARM GT 120 MIN; LUMBAR SPINE - 2-3 VIEW FLUOROSCOPY TIME:  Radiation Exposure Index (as provided by the fluoroscopic device): If the device does not provide the exposure index:  Fluoroscopy Time (in minutes and seconds): 4 minutes and 34 seconds. Number of Acquired Images:  Two fluoro images. COMPARISON:  Abdominal pelvic CT 08/07/2015. FINDINGS: Spot frontal and lateral fluoroscopic images of the lower lumbar spine are submitted. Patient is status post previous anterior fusion at L5-S1. Patient has undergone interval TLIF at L4-5 with bilateral pedicle screws, interconnecting rods and an interbody spacer. The hardware is well positioned. No complications are identified. IMPRESSION: No complications status post interval  L4-5 TLIF. Electronically Signed   By: Carey BullocksWilliam  Veazey M.D.   On: 03/01/2016 14:08    Disposition: 01-Home or Self Care        Follow-up Information    Follow up with Alvy BealBROOKS,DAHARI D, MD. Schedule an appointment as soon as possible for a visit in 2 weeks.   Specialty:  Orthopedic Surgery   Why:  If symptoms worsen, For suture removal, For wound re-check   Contact information:   [address] [place] [address] [phone]        Signed: Kirt BoysMayo, Wlliam Grosso Christina 03/15/2016, 8:10 AM

## 2017-03-16 ENCOUNTER — Emergency Department (HOSPITAL_COMMUNITY)
Admission: EM | Admit: 2017-03-16 | Discharge: 2017-03-17 | Disposition: A | Discharge status: Home / Self Care | Payer: Self-pay | Attending: Emergency Medicine | Admitting: Emergency Medicine

## 2017-03-16 ENCOUNTER — Emergency Department (HOSPITAL_COMMUNITY): Payer: Self-pay

## 2017-03-16 ENCOUNTER — Encounter (HOSPITAL_COMMUNITY): Payer: Self-pay

## 2017-03-16 DIAGNOSIS — M5442 Lumbago with sciatica, left side: Secondary | ICD-10-CM

## 2017-03-16 DIAGNOSIS — W108XXA Fall (on) (from) other stairs and steps, initial encounter: Secondary | ICD-10-CM | POA: Insufficient documentation

## 2017-03-16 DIAGNOSIS — Y999 Unspecified external cause status: Secondary | ICD-10-CM | POA: Insufficient documentation

## 2017-03-16 DIAGNOSIS — Y9301 Activity, walking, marching and hiking: Secondary | ICD-10-CM | POA: Insufficient documentation

## 2017-03-16 DIAGNOSIS — W19XXXA Unspecified fall, initial encounter: Secondary | ICD-10-CM

## 2017-03-16 DIAGNOSIS — M545 Low back pain: Secondary | ICD-10-CM | POA: Insufficient documentation

## 2017-03-16 DIAGNOSIS — Z79899 Other long term (current) drug therapy: Secondary | ICD-10-CM | POA: Insufficient documentation

## 2017-03-16 DIAGNOSIS — Y929 Unspecified place or not applicable: Secondary | ICD-10-CM | POA: Insufficient documentation

## 2017-03-16 DIAGNOSIS — Z87891 Personal history of nicotine dependence: Secondary | ICD-10-CM | POA: Insufficient documentation

## 2017-03-16 NOTE — ED Notes (Signed)
Pt is alert and orinted x 4 and verbally responsive. No bruising or injury is noted at this time.

## 2017-03-16 NOTE — ED Provider Notes (Signed)
[facility]-EMERGENCY DEPT Provider Note   CSN: [id number] Arrival date & time: 03/16/17  2127     History   Chief Complaint Chief Complaint  Patient presents with  . Fall  . Back Pain  . Leg Pain    HPI [name] is a 41 y.o. female.  HPI   [name] is a 41 y.o. female, with a history of chronic back pain, bipolar, fibromyalgia, presenting to the ED for evaluation following a mechanical fall that occurred shortly prior to arrival. Patient states that she slipped walking down a set of carpeted stairs. She fell onto her buttocks and slid down the stairs. She complains of bilateral lower back pain as well as left hip pain. Pain is aching, 9 out of 10, radiating down the back of the left leg. She took naproxen, Robaxin, and Percocet prior to arrival. Patient drove herself to the ED. Denies head injury, LOC, neuro deficits, changes in bowel or bladder function, or any other complaints.  Orthopedic surgeon: Dr. Venita Lick    Past Medical History:  Diagnosis Date  . Anxiety   . Arthritis   . Bipolar 1 disorder (HCC)   . Degenerative disk disease   . Depression   . Fibromyalgia   . GERD (gastroesophageal reflux disease)   . Migraines   . Neuropathy   . Seasonal allergies     Patient Active Problem List   Diagnosis Date Noted  . Back pain 03/01/2016  . Paresthesias 04/14/2013  . Chronic back pain 04/14/2013  . Headache(784.0) 04/14/2013    Past Surgical History:  Procedure Laterality Date  . ABDOMINAL HYSTERECTOMY    . BACK SURGERY    . CARPAL TUNNEL RELEASE    . LEG SURGERY    . planter Bilateral   . TRANSFORAMINAL LUMBAR INTERBODY FUSION (TLIF) WITH PEDICLE SCREW FIXATION 1 LEVEL N/A 03/01/2016   Procedure: TRANSFORAMINAL LUMBAR INTERBODY FUSION (TLIF) WITH PEDICLE SCREW FIXATION L4 - L5 1 LEVEL, L3-4 DISCECTOMY ON THE LEFT;  Surgeon: Venita Lick, MD;  Location: [facility] OR;  Service: Orthopedics;  Laterality: N/A;  . TUBAL LIGATION      OB History      No data available       Home Medications    Prior to Admission medications   Medication Sig Start Date End Date Taking? Authorizing Provider  albuterol (PROVENTIL HFA;VENTOLIN HFA) 108 (90 Base) MCG/ACT inhaler Inhale 2 puffs into the lungs every 6 (six) hours as needed for wheezing or shortness of breath.    [provider]  CALCIUM PO Take 1 tablet by mouth daily.    [provider]  Cholecalciferol (VITAMIN D PO) Take 1 tablet by mouth daily.    [provider]  diphenhydrAMINE (BENADRYL) 25 MG tablet Take 50 mg by mouth daily as needed (severe allergic reaction).    [provider]  DULoxetine (CYMBALTA) 30 MG capsule Take 30 mg by mouth 2 (two) times daily. 01/24/16   [provider]  EPINEPHrine (EPI-PEN) 0.3 mg/0.3 mL DEVI Inject 0.3 mg into the muscle once as needed (severe allergic reactions).     [provider]  esomeprazole (NEXIUM) 40 MG capsule Take 40 mg by mouth daily before breakfast.    [provider]  methocarbamol (ROBAXIN) 500 MG tablet Take 1 tablet (500 mg total) by mouth 3 (three) times daily as needed for muscle spasms. 03/01/16   Venita Lick, MD  Multiple Vitamin (MULTIVITAMIN WITH MINERALS) TABS tablet Take 1 tablet by  mouth daily.    [provider]  ondansetron (ZOFRAN) 4 MG tablet Take 1 tablet (4 mg total) by mouth every 8 (eight) hours as needed for nausea or vomiting. 03/01/16   Venita LickBrooks, Dahari, MD  oxyCODONE-acetaminophen (PERCOCET) 10-325 MG tablet Take 1 tablet by mouth every 4 (four) hours as needed for pain. 03/01/16   Venita LickBrooks, Dahari, MD    Family History History reviewed. No pertinent family history.  Social History Social History  Substance Use Topics  . Smoking status: Former Smoker    Packs/day: 0.50    Types: Cigarettes    Quit date: 05/07/2013  . Smokeless tobacco: Never Used  . Alcohol use No     Allergies   Bee venom; Hornet venom; Wasp venom; Yellow jacket  venom; Amoxicillin; Aspirin; Erythromycin; Penicillins; Prozac [fluoxetine hcl]; and Food   Review of Systems Review of Systems  HENT: Negative for facial swelling.   Cardiovascular: Negative for chest pain.  Gastrointestinal: Negative for abdominal pain, nausea and vomiting.  Genitourinary: Negative for difficulty urinating.  Musculoskeletal: Positive for arthralgias and back pain. Negative for neck pain.  Neurological: Negative for dizziness, weakness, light-headedness, numbness and headaches.  All other systems reviewed and are negative.    Physical Exam Updated Vital Signs BP 114/73 (BP Location: Left Arm)   Pulse 77   Temp 98.1 F (36.7 C) (Oral)   Resp 20   Ht 5\' 5"  (1.651 m)   Wt 90.7 kg (200 lb)   SpO2 96%   BMI 33.28 kg/m   Physical Exam  Constitutional: She appears well-developed and well-nourished. No distress.  HENT:  Head: Normocephalic and atraumatic.  Eyes: Pupils are equal, round, and reactive to light. Conjunctivae and EOM are normal.  Neck: Normal range of motion. Neck supple.  Cardiovascular: Normal rate, regular rhythm, normal heart sounds and intact distal pulses.   Pulmonary/Chest: Effort normal and breath sounds normal. No respiratory distress.  Abdominal: Soft. There is no tenderness. There is no guarding.  Musculoskeletal: She exhibits tenderness. She exhibits no edema.  Tenderness to the bilateral lumbar musculature as well as midline lumbar spine without noted swelling, crepitus, deformity, or step-off. Tenderness extends into the left buttocks. Tenderness also noted to the left lateral hip without deformity, rotation, shortening, crepitus, or swelling. Patient has full range of motion in the bilateral hips, knees, and ankles. No other midline spinal tenderness. Normal motor function in the upper extremities.  Neurological: She is alert.  No sensory deficits. Strength 5/5 with flexion and extension at the bilateral hips, knees, and ankles. 5 out  of 5 grip strength. No gait disturbance. Coordination intact including heel to shin and finger to nose. Cranial nerves III-XII grossly intact. No facial droop.   Skin: Skin is warm and dry. She is not diaphoretic.  Psychiatric: She has a normal mood and affect. Her behavior is normal.  Nursing note and vitals reviewed.    ED Treatments / Results  Labs (all labs ordered are listed, but only abnormal results are displayed) Labs Reviewed - No data to display  EKG  EKG Interpretation None       Radiology Dg Lumbar Spine Complete  Result Date: 03/16/2017 CLINICAL DATA:  41 year old female with fall and back pain. EXAM: LUMBAR SPINE - COMPLETE 4+ VIEW COMPARISON:  Radiograph dated 03/02/2016 FINDINGS: There is no acute fracture or subluxation of the lumbar spine. L4-L5 and L5-S1 disc spacer and fixation hardware as seen on the prior radiograph. The fixation hardware appear intact. There is disc  space narrowing with disc desiccation and vacuum phenomena at L3-L4. The visualized posterior elements are intact. Soft tissues appear unremarkable. IMPRESSION: No acute/traumatic lumbar spine pathology. Lower lumbar fixation as seen on the prior radiograph. Electronically Signed   By: Elgie Collard M.D.   On: 03/16/2017 23:43   Dg Hip Unilat With Pelvis 2-3 Views Left  Result Date: 03/16/2017 CLINICAL DATA:  41 year old female with fall and left hip pain. EXAM: DG HIP (WITH OR WITHOUT PELVIS) 2-3V LEFT COMPARISON:  None. FINDINGS: There is no acute fracture or dislocation. The bones are well mineralized. No significant arthritic changes. Lower lumbar interbody spacer and fusion screws noted. The soft tissues appear unremarkable. IMPRESSION: No acute fracture or dislocation. Electronically Signed   By: Elgie Collard M.D.   On: 03/16/2017 23:14    Procedures Procedures (including critical care time)  Medications Ordered in ED Medications - No data to display   Initial Impression / Assessment  and Plan / ED Course  I have reviewed the triage vital signs and the nursing notes.  Pertinent labs & imaging results that were available during my care of the patient were reviewed by me and considered in my medical decision making (see chart for details).     Patient presents with back pain in a sciatica pattern following a fall that occurred shortly prior to arrival. Patient has no neuro or functional deficits noted on exam. No acute abnormalities on x-rays. Patient's home treatment regimen is already identical to what I can offer her upon discharge. I do not think I can add anything to this regimen. She drove to the ED, therefore she can not receive narcotic analgesics here in the ED.  Orthopedic follow-up. The patient was given instructions for home care as well as return precautions. Patient voices understanding of these instructions, accepts the plan, and is comfortable with discharge.    Final Clinical Impressions(s) / ED Diagnoses   Final diagnoses:  Fall, initial encounter  Acute bilateral low back pain with left-sided sciatica    New Prescriptions New Prescriptions   No medications on file     Concepcion Living 03/16/17 2349    Gerhard Munch, MD 03/17/17 [phone]

## 2017-03-16 NOTE — ED Triage Notes (Addendum)
Pt states that she was walking down stairs and trip and fell down 6 steps/ Pt denies LOC and states that she fell on her left side hip  and now having pain in her backand shooting down her left leg pain of 8/10 on pain scale. Pt reports having back surgery 02/2016. Pt states she took a percocet at home but was not effective in managing pain.

## 2017-03-16 NOTE — Discharge Instructions (Signed)
Expect your soreness to increase over the next 2-3 days. Take it easy, but do not lay around too much as this may make any stiffness worse.  Continue with your home pain medication regimen. Exercises: Be sure to perform the attached exercises starting with three times a week and working up to performing them daily. This is an essential part of preventing long term problems.   Follow-up with your orthopedic specialist as soon as possible.

## 2018-04-18 ENCOUNTER — Encounter (HOSPITAL_COMMUNITY): Payer: Self-pay

## 2018-04-18 ENCOUNTER — Emergency Department (HOSPITAL_COMMUNITY)
Admission: EM | Admit: 2018-04-18 | Discharge: 2018-04-19 | Disposition: A | Discharge status: Home / Self Care | Payer: Self-pay | Attending: Emergency Medicine | Admitting: Emergency Medicine

## 2018-04-18 ENCOUNTER — Emergency Department (HOSPITAL_COMMUNITY): Payer: Self-pay

## 2018-04-18 DIAGNOSIS — Z87891 Personal history of nicotine dependence: Secondary | ICD-10-CM | POA: Insufficient documentation

## 2018-04-18 DIAGNOSIS — M79605 Pain in left leg: Secondary | ICD-10-CM | POA: Insufficient documentation

## 2018-04-18 DIAGNOSIS — Z79899 Other long term (current) drug therapy: Secondary | ICD-10-CM | POA: Insufficient documentation

## 2018-04-18 DIAGNOSIS — R0602 Shortness of breath: Secondary | ICD-10-CM | POA: Insufficient documentation

## 2018-04-18 LAB — I-STAT BETA HCG BLOOD, ED (NOT ORDERABLE): I-stat hCG, quantitative: <5 m[IU]/mL (ref <5)

## 2018-04-18 LAB — D-DIMER, QUANTITATIVE: D-Dimer, Quant: 0.32 ug/mL-FEU (ref 0.00–0.50)

## 2018-04-18 LAB — CBC
HCT: 38.7 % (ref 36.0–46.0)
Hemoglobin: 13.1 g/dL (ref 12.0–15.0)
MCH: 32 pg (ref 26.0–34.0)
MCHC: 33.9 g/dL (ref 30.0–36.0)
MCV: 94.6 fL (ref 78.0–100.0)
PLATELETS: 336 10*3/uL (ref 150–400)
RBC: 4.09 MIL/uL (ref 3.87–5.11)
RDW: 12.9 % (ref 11.5–15.5)
WBC: 9.9 10*3/uL (ref 4.0–10.5)

## 2018-04-18 LAB — BASIC METABOLIC PANEL
Anion gap: 9 (ref 5–15)
BUN: 15 mg/dL (ref 6–20)
CALCIUM: 9.1 mg/dL (ref 8.9–10.3)
CO2: 25 mmol/L (ref 22–32)
CREATININE: 0.7 mg/dL (ref 0.44–1.00)
Chloride: 105 mmol/L (ref 98–111)
GFR calc Af Amer: >60 mL/min (ref >60)
Glucose, Bld: 106 mg/dL — ABNORMAL HIGH (ref 70–99)
Potassium: 4.1 mmol/L (ref 3.5–5.1)
SODIUM: 139 mmol/L (ref 135–145)

## 2018-04-18 LAB — POCT I-STAT TROPONIN I
TROPONIN I, POC: 0 ng/mL (ref 0.00–0.08)
Troponin i, poc: 0.01 ng/mL (ref 0.00–0.08)

## 2018-04-18 MED ORDER — ENOXAPARIN SODIUM 100 MG/ML ~~LOC~~ SOLN
1.0000 mg/kg | Freq: Once | SUBCUTANEOUS | Status: AC
  Administered 2018-04-18: 90 mg via SUBCUTANEOUS
  Filled 2018-04-18: qty 0.9

## 2018-04-18 NOTE — ED Provider Notes (Signed)
[facility]-EMERGENCY DEPT Provider Note   CSN: [id number] Arrival date & time: 04/18/18  1922     History   Chief Complaint Chief Complaint  Patient presents with  . Shortness of Breath  . Leg Pain    HPI [name] is a 42 y.o. female.  The history is provided by the patient.  Shortness of Breath  This is a new problem. The problem occurs intermittently.The current episode started more than 2 days ago. Progression since onset: waxing and waning. Associated symptoms include chest pain (intermittent) and leg pain. Pertinent negatives include no fever, no rhinorrhea, no sore throat, no ear pain, no cough, no sputum production, no hemoptysis, no wheezing, no vomiting, no abdominal pain, no rash and no leg swelling. It is unknown what precipitated the problem. She has tried nothing for the symptoms. The treatment provided no relief. She has had no prior hospitalizations. Associated medical issues do not include COPD, PE, CAD or DVT.    Past Medical History:  Diagnosis Date  . Anxiety   . Arthritis   . Bipolar 1 disorder (HCC)   . Degenerative disk disease   . Depression   . Fibromyalgia   . GERD (gastroesophageal reflux disease)   . Migraines   . Neuropathy   . Seasonal allergies     Patient Active Problem List   Diagnosis Date Noted  . Back pain 03/01/2016  . Paresthesias 04/14/2013  . Chronic back pain 04/14/2013  . Headache(784.0) 04/14/2013    Past Surgical History:  Procedure Laterality Date  . ABDOMINAL HYSTERECTOMY    . BACK SURGERY    . CARPAL TUNNEL RELEASE    . LEG SURGERY    . planter Bilateral   . TRANSFORAMINAL LUMBAR INTERBODY FUSION (TLIF) WITH PEDICLE SCREW FIXATION 1 LEVEL N/A 03/01/2016   Procedure: TRANSFORAMINAL LUMBAR INTERBODY FUSION (TLIF) WITH PEDICLE SCREW FIXATION L4 - L5 1 LEVEL, L3-4 DISCECTOMY ON THE LEFT;  Surgeon: Venita Lickahari Brooks, MD;  Location: [facility] OR;  Service: Orthopedics;  Laterality: N/A;  . TUBAL  LIGATION       OB History   None      Home Medications    Prior to Admission medications   Medication Sig Start Date End Date Taking? Authorizing Provider  albuterol (PROVENTIL HFA;VENTOLIN HFA) 108 (90 Base) MCG/ACT inhaler Inhale 2 puffs into the lungs every 6 (six) hours as needed for wheezing or shortness of breath.   Yes [provider]  CALCIUM PO Take 1 tablet by mouth daily.   Yes [provider]  cariprazine (VRAYLAR) capsule Take 1.5 mg by mouth at bedtime.   Yes [provider]  Cholecalciferol (VITAMIN D PO) Take 1 tablet by mouth daily.   Yes [provider]  diphenhydrAMINE (BENADRYL) 25 MG tablet Take 50 mg by mouth daily as needed (severe allergic reaction).   Yes [provider]  divalproex (DEPAKOTE) 500 MG DR tablet Take 500 mg by mouth 2 (two) times daily.   Yes [provider]  EPINEPHrine (EPI-PEN) 0.3 mg/0.3 mL DEVI Inject 0.3 mg into the muscle once as needed (severe allergic reactions).    Yes [provider]  hydrOXYzine (VISTARIL) 25 MG capsule Take 25 mg by mouth as needed for anxiety or itching.   Yes [provider]  methocarbamol (ROBAXIN) 500 MG tablet Take 1 tablet (500 mg total) by mouth 3 (three) times daily as needed for muscle spasms. 03/01/16  Yes Venita LickBrooks, Dahari, MD  mirtazapine (REMERON) 15  MG tablet Take 15 mg by mouth at bedtime.   Yes [provider]  Multiple Vitamin (MULTIVITAMIN WITH MINERALS) TABS tablet Take 1 tablet by mouth daily.   Yes [provider]  naproxen (NAPROSYN) 500 MG tablet Take 500 mg by mouth every 12 (twelve) hours as needed. for pain 03/16/18  Yes [provider]  oxyCODONE-acetaminophen (PERCOCET) 10-325 MG tablet Take 1 tablet by mouth every 4 (four) hours as needed for pain. 03/01/16  Yes Venita LickBrooks, Dahari, MD  ondansetron (ZOFRAN) 4 MG tablet Take 1 tablet (4 mg total) by mouth every 8 (eight) hours as needed for nausea or  vomiting. Patient not taking: Reported on 04/18/2018 03/01/16   Venita LickBrooks, Dahari, MD    Family History History reviewed. No pertinent family history.  Social History Social History   Tobacco Use  . Smoking status: Former Smoker    Packs/day: 0.50    Types: Cigarettes    Last attempt to quit: 05/07/2013    Years since quitting: 4.9  . Smokeless tobacco: Never Used  Substance Use Topics  . Alcohol use: No  . Drug use: No     Allergies   Bee venom; Hornet venom; Wasp venom; Yellow jacket venom; Erythromycin; Amoxicillin; Aspirin; Penicillins; Prozac [fluoxetine hcl]; and Food   Review of Systems Review of Systems  Constitutional: Negative for chills and fever.  HENT: Negative for ear pain, rhinorrhea and sore throat.   Eyes: Negative for pain and visual disturbance.  Respiratory: Positive for shortness of breath. Negative for cough, hemoptysis, sputum production and wheezing.   Cardiovascular: Positive for chest pain (intermittent). Negative for palpitations and leg swelling.  Gastrointestinal: Negative for abdominal pain and vomiting.  Genitourinary: Negative for dysuria and hematuria.  Musculoskeletal: Negative for arthralgias and back pain.       Left calf pain  Skin: Negative for color change and rash.  Neurological: Negative for seizures and syncope.  All other systems reviewed and are negative.    Physical Exam Updated Vital Signs  ED Triage Vitals  Enc Vitals Group     BP 04/18/18 1938 131/74     Pulse Rate 04/18/18 1938 77     Resp 04/18/18 1938 18     Temp 04/18/18 1938 97.8 F (36.6 C)     Temp Source 04/18/18 1938 Oral     SpO2 04/18/18 1938 99 %     Weight 04/18/18 2158 198 lb 6.6 oz (90 kg)     Height --      Head Circumference --      Peak Flow --      Pain Score 04/18/18 2003 6     Pain Loc --      Pain Edu? --      Excl. in GC? --     Physical Exam  Constitutional: She appears well-developed and well-nourished. No distress.  HENT:  Head:  Normocephalic and atraumatic.  Eyes: Pupils are equal, round, and reactive to light. Conjunctivae and EOM are normal.  Neck: Normal range of motion. Neck supple.  Cardiovascular: Normal rate and regular rhythm.  No murmur heard. Pulmonary/Chest: Effort normal and breath sounds normal. No respiratory distress. She has no decreased breath sounds. She has no wheezes.  Abdominal: Soft. There is no tenderness.  Musculoskeletal: Normal range of motion. She exhibits no edema.       Right lower leg: Normal. She exhibits no edema.       Left lower leg: She exhibits tenderness (TTP to left calf). She  exhibits no edema.  Neurological: She is alert.  Skin: Skin is warm and dry.  Slight area of redness to left calf but no warmth or fluctuance  Psychiatric: She has a normal mood and affect.  Nursing note and vitals reviewed.    ED Treatments / Results  Labs (all labs ordered are listed, but only abnormal results are displayed) Labs Reviewed  BASIC METABOLIC PANEL - Abnormal; Notable for the following components:      Result Value   Glucose, Bld 106 (*)    All other components within normal limits  CBC  D-DIMER, QUANTITATIVE (NOT AT [facility])  I-STAT TROPONIN, ED  I-STAT BETA HCG BLOOD, ED ([facility], [facility], [facility] ONLY)  POCT I-STAT TROPONIN I  I-STAT BETA HCG BLOOD, ED (NOT ORDERABLE)  I-STAT TROPONIN, ED  POCT I-STAT TROPONIN I    EKG EKG Interpretation  Date/Time:  Thursday April 18 2018 19:37:45 EDT Ventricular Rate:  77 PR Interval:    QRS Duration: 94 QT Interval:  388 QTC Calculation: 440 R Axis:   56 Text Interpretation:  Sinus rhythm Low voltage, extremity leads Baseline wander in lead(s) II No significant change since last tracing Confirmed by Virgina Norfolk [phone]) on 04/18/2018 9:28:26 PM   Radiology Dg Chest 2 View  Result Date: 04/18/2018 CLINICAL DATA:  Short of breath EXAM: CHEST - 2 VIEW COMPARISON:  01/03/2011 FINDINGS: The heart size and mediastinal contours are within normal  limits. Both lungs are clear. The visualized skeletal structures are unremarkable. IMPRESSION: No active cardiopulmonary disease. Electronically Signed   By: Jasmine Pang M.D.   On: 04/18/2018 20:19    Procedures Procedures (including critical care time)  Medications Ordered in ED Medications  enoxaparin (LOVENOX) injection 90 mg (90 mg Subcutaneous Given 04/18/18 2217)     Initial Impression / Assessment and Plan / ED Course  I have reviewed the triage vital signs and the nursing notes.  Pertinent labs & imaging results that were available during my care of the patient were reviewed by me and considered in my medical decision making (see chart for details).     VAUGHN FRIEZE is a 42 year old female with history of bipolar, anxiety, depression, fibromyalgia who presents to the ED with shortness of breath and left leg pain.  Patient with normal vitals.  No fever.  Patient with symptoms over the last several days.  No specific PE risk factors.  No cardiac risk factors.  Denies any chest pain.  Patient has no infectious symptoms such as fever, cough, sputum production.  Patient overall well-appearing.  Clear breath sounds.  Had some left calf tenderness on exam.  Has some redness of the left calf but no signs to suggest infection at this time.  Patient with EKG that showed sinus rhythm with no signs of ischemic changes.  Troponins negative x2.  Patient with low risk HEAR score doubt ACS.  Patient with no significant electrolyte abnormality, acute kidney injury.  D-dimer negative and doubt PE.  Chest x-ray showed no signs of pneumonia, pneumothorax, pleural effusion.  Given ongoing left calf pain concern for DVT.  Patient given treatment dose Lovenox and given prescription to get DVT study tomorrow morning.  Possible patient has some superficial thrombophlebitis.  Told to return to ED if symptoms worsen and discharged from ED in good condition.  Final Clinical Impressions(s) / ED Diagnoses    Final diagnoses:  Shortness of breath  Left leg pain    ED Discharge Orders  Ordered    LE VENOUS     04/18/18 2354           Virgina Norfolk, DO 04/19/18 0215

## 2018-04-18 NOTE — ED Triage Notes (Signed)
Pt complains of being short of breath and mild chest pain for a few days, today she noticed a warm red area on her left lower leg Pt hasn't traveled and no injury

## 2018-04-19 ENCOUNTER — Encounter (HOSPITAL_COMMUNITY): Payer: Self-pay

## 2018-04-19 ENCOUNTER — Inpatient Hospital Stay (HOSPITAL_COMMUNITY): Admission: RE | Admit: 2018-04-19 | Payer: Self-pay | Source: Ambulatory Visit

## 2018-04-22 ENCOUNTER — Ambulatory Visit (HOSPITAL_COMMUNITY): Admission: RE | Admit: 2018-04-22 | Payer: Self-pay | Source: Ambulatory Visit

## 2020-01-29 ENCOUNTER — Encounter (HOSPITAL_COMMUNITY): Payer: Self-pay | Admitting: Emergency Medicine

## 2020-01-29 ENCOUNTER — Emergency Department (HOSPITAL_COMMUNITY): Payer: BC Managed Care – PPO

## 2020-01-29 ENCOUNTER — Emergency Department (HOSPITAL_COMMUNITY)
Admission: EM | Admit: 2020-01-29 | Discharge: 2020-01-29 | Disposition: A | Discharge status: Home / Self Care | Payer: BC Managed Care – PPO | Attending: Emergency Medicine | Admitting: Emergency Medicine

## 2020-01-29 DIAGNOSIS — S99912A Unspecified injury of left ankle, initial encounter: Secondary | ICD-10-CM | POA: Diagnosis present

## 2020-01-29 DIAGNOSIS — M797 Fibromyalgia: Secondary | ICD-10-CM | POA: Diagnosis not present

## 2020-01-29 DIAGNOSIS — S93402A Sprain of unspecified ligament of left ankle, initial encounter: Secondary | ICD-10-CM | POA: Diagnosis not present

## 2020-01-29 DIAGNOSIS — Y999 Unspecified external cause status: Secondary | ICD-10-CM | POA: Insufficient documentation

## 2020-01-29 DIAGNOSIS — Y939 Activity, unspecified: Secondary | ICD-10-CM | POA: Diagnosis not present

## 2020-01-29 DIAGNOSIS — Y929 Unspecified place or not applicable: Secondary | ICD-10-CM | POA: Insufficient documentation

## 2020-01-29 DIAGNOSIS — X509XXA Other and unspecified overexertion or strenuous movements or postures, initial encounter: Secondary | ICD-10-CM | POA: Insufficient documentation

## 2020-01-29 DIAGNOSIS — T1490XA Injury, unspecified, initial encounter: Secondary | ICD-10-CM

## 2020-01-29 DIAGNOSIS — Z87891 Personal history of nicotine dependence: Secondary | ICD-10-CM | POA: Insufficient documentation

## 2020-01-29 MED ORDER — OXYCODONE-ACETAMINOPHEN 5-325 MG PO TABS
1.0000 | ORAL_TABLET | Freq: Once | ORAL | Status: AC
  Administered 2020-01-29: 1 via ORAL
  Filled 2020-01-29: qty 1

## 2020-01-29 NOTE — Discharge Instructions (Signed)
Your x-ray today did not show any fracture or dislocation.  I have provided the number to Dr. Dion Saucier, orthopedist please follow-up for your left ankle sprain.  You may continue to keep left leg elevated, apply ice to the area along with take anti-inflammatories.

## 2020-01-29 NOTE — ED Provider Notes (Signed)
[facility]-EMERGENCY DEPT Provider Note   CSN: [id number] Arrival date & time: 01/29/20  0001     History Chief Complaint  Patient presents with  . Ankle Injury  . Foot Injury    [name] is a 44 y.o. female.  44 y.o female with a PMH of Bipolar, Anxiety, Fibromyalgia presents to the ED s/p fall. She reports walking down the stairs when she stepped "wrong ", reports her left foot inverted, there is swelling to the outer aspect of her left foot.  There is pain with ambulation.  Reports she did not fall, was able to catch herself.  She is currently on naproxen for fibromyalgia at home per her PCP.  Not take any medication for improvement in her symptoms.  Denies any other injury.  The history is provided by the patient.       Past Medical History:  Diagnosis Date  . Anxiety   . Arthritis   . Bipolar 1 disorder (HCC)   . Degenerative disk disease   . Depression   . Fibromyalgia   . GERD (gastroesophageal reflux disease)   . Migraines   . Neuropathy   . Seasonal allergies     Patient Active Problem List   Diagnosis Date Noted  . Back pain 03/01/2016  . Paresthesias 04/14/2013  . Chronic back pain 04/14/2013  . Headache(784.0) 04/14/2013    Past Surgical History:  Procedure Laterality Date  . ABDOMINAL HYSTERECTOMY    . BACK SURGERY    . CARPAL TUNNEL RELEASE    . LEG SURGERY    . planter Bilateral   . TRANSFORAMINAL LUMBAR INTERBODY FUSION (TLIF) WITH PEDICLE SCREW FIXATION 1 LEVEL N/A 03/01/2016   Procedure: TRANSFORAMINAL LUMBAR INTERBODY FUSION (TLIF) WITH PEDICLE SCREW FIXATION L4 - L5 1 LEVEL, L3-4 DISCECTOMY ON THE LEFT;  Surgeon: Venita Lick, MD;  Location: [facility] OR;  Service: Orthopedics;  Laterality: N/A;  . TUBAL LIGATION       OB History   No obstetric history on file.     No family history on file.  Social History   Tobacco Use  . Smoking status: Former Smoker    Packs/day: 0.50    Types: Cigarettes    Quit  date: 05/07/2013    Years since quitting: 6.7  . Smokeless tobacco: Never Used  Substance Use Topics  . Alcohol use: No  . Drug use: No    Home Medications Prior to Admission medications   Medication Sig Start Date End Date Taking? Authorizing Provider  albuterol (PROVENTIL HFA;VENTOLIN HFA) 108 (90 Base) MCG/ACT inhaler Inhale 2 puffs into the lungs every 6 (six) hours as needed for wheezing or shortness of breath.    [provider]  CALCIUM PO Take 1 tablet by mouth daily.    [provider]  cariprazine (VRAYLAR) capsule Take 1.5 mg by mouth at bedtime.    [provider]  Cholecalciferol (VITAMIN D PO) Take 1 tablet by mouth daily.    [provider]  diphenhydrAMINE (BENADRYL) 25 MG tablet Take 50 mg by mouth daily as needed (severe allergic reaction).    [provider]  divalproex (DEPAKOTE) 500 MG DR tablet Take 500 mg by mouth 2 (two) times daily.    [provider]  EPINEPHrine (EPI-PEN) 0.3 mg/0.3 mL DEVI Inject 0.3 mg into the muscle once as needed (severe allergic reactions).     [provider]  hydrOXYzine (VISTARIL) 25 MG capsule Take 25 mg by mouth as  needed for anxiety or itching.    [provider]  methocarbamol (ROBAXIN) 500 MG tablet Take 1 tablet (500 mg total) by mouth 3 (three) times daily as needed for muscle spasms. 03/01/16   Melina Schools, MD  mirtazapine (REMERON) 15 MG tablet Take 15 mg by mouth at bedtime.    [provider]  Multiple Vitamin (MULTIVITAMIN WITH MINERALS) TABS tablet Take 1 tablet by mouth daily.    [provider]  naproxen (NAPROSYN) 500 MG tablet Take 500 mg by mouth every 12 (twelve) hours as needed. for pain 03/16/18   [provider]  ondansetron (ZOFRAN) 4 MG tablet Take 1 tablet (4 mg total) by mouth every 8 (eight) hours as needed for nausea or vomiting. Patient not taking: Reported on 04/18/2018 03/01/16   Melina Schools, MD   oxyCODONE-acetaminophen (PERCOCET) 10-325 MG tablet Take 1 tablet by mouth every 4 (four) hours as needed for pain. 03/01/16   Melina Schools, MD    Allergies    Bee venom, Hornet venom, Wasp venom, Yellow jacket venom, Erythromycin, Amoxicillin, Aspirin, Penicillins, Prozac [fluoxetine hcl], and Food  Review of Systems   Review of Systems  Constitutional: Negative for fever.  Musculoskeletal: Positive for arthralgias.    Physical Exam Updated Vital Signs BP 136/78 (BP Location: Right Arm)   Pulse 89   Temp 98.2 F (36.8 C) (Oral)   Resp 19   SpO2 100%   Physical Exam Vitals and nursing note reviewed.  Constitutional:      Appearance: Normal appearance.  HENT:     Head: Normocephalic and atraumatic.     Nose: Nose normal.  Cardiovascular:     Rate and Rhythm: Normal rate.     Pulses:          Dorsalis pedis pulses are 2+ on the left side.       Posterior tibial pulses are 2+ on the left side.  Pulmonary:     Effort: Pulmonary effort is normal.  Abdominal:     General: Abdomen is flat.     Tenderness: There is no abdominal tenderness.  Musculoskeletal:     Cervical back: Normal range of motion and neck supple.       Feet:  Feet:     Left foot:     Skin integrity: Skin integrity normal. No ulcer, skin breakdown, erythema, warmth, callus or dry skin.     Toenail Condition: Left toenails are normal.     Comments: Pulses present, capillary refill is intact.  No erythema, there is mild swelling noted.  Limited range of motion due to pain. Skin:    General: Skin is warm and dry.  Neurological:     Mental Status: She is alert and oriented to person, place, and time.     ED Results / Procedures / Treatments   Labs (all labs ordered are listed, but only abnormal results are displayed) Labs Reviewed - No data to display  EKG None  Radiology DG Ankle Complete Left  Result Date: 01/29/2020 CLINICAL DATA:  Twisted ankle walking down steps earlier today. Pain and  swelling laterally. Unable to bear weight. EXAM: LEFT ANKLE COMPLETE - 3+ VIEW COMPARISON:  None. FINDINGS: There is no evidence of fracture, dislocation, or definite joint effusion. Small osseous density adjacent to the anterior distal tibia has well corticated margins and may represent sequela of remote prior injury or an accessory ossicle. There is a plantar calcaneal spur. Mild lateral soft tissue edema. IMPRESSION: Lateral soft tissue edema.  No acute fracture or subluxation. Electronically Signed   By: Narda Rutherford M.D.   On: 01/29/2020 01:15   DG Foot Complete Left  Result Date: 01/29/2020 CLINICAL DATA:  Left foot and ankle pain after twisting injury walking down steps earlier today. Pain laterally. Unable to bear weight. EXAM: LEFT FOOT - COMPLETE 3+ VIEW COMPARISON:  None. FINDINGS: There is no evidence of fracture or dislocation. Pes cavus. Moderate plantar calcaneal spur. Single screw traverses the distal fifth metatarsal. Minor degenerative change of the first metatarsal phalangeal joint. Soft tissue edema is more prominent laterally. IMPRESSION: 1. No fracture or subluxation of the left foot. Lateral soft tissue edema. 2. Pes cavus.  Moderate plantar calcaneal spur. Electronically Signed   By: Narda Rutherford M.D.   On: 01/29/2020 01:13    Procedures Procedures (including critical care time)  Medications Ordered in ED Medications  oxyCODONE-acetaminophen (PERCOCET/ROXICET) 5-325 MG per tablet 1 tablet (has no administration in time range)    ED Course  I have reviewed the triage vital signs and the nursing notes.  Pertinent labs & imaging results that were available during my care of the patient were reviewed by me and considered in my medical decision making (see chart for details).    MDM Rules/Calculators/A&P    Patient with past medical history of fibromyalgia presents to the ED with complaints of left ankle pain after going down the steps, no fall.  Reports ankle likely  inverted.  There is swelling noted to the outer aspect of her left ankle, she is neurovascularly intact, pulses are present, sensation is intact but limited range of motion due to pain.  Is overall well-appearing, denies any other trauma.  X-ray of her left ankle did not show any acute fracture or dislocation.  Some suspicion for sprain.  Discussed rice therapy along with symptomatic treatment at home with anti-inflammatories, she does report he currently takes naproxen for fibromyalgia.  We agreed on a Percocet tablet while in the ED along with continue with NSAIDs while at home.  She will be provided with a follow-up number for orthopedics.  She was also placed on a left Velcro splint along with provided with crutches in order to allow for rest of her foot. Return precautions discussed at length.     Portions of this note were generated with Scientist, clinical (histocompatibility and immunogenetics). Dictation errors may occur despite best attempts at proofreading.  Final Clinical Impression(s) / ED Diagnoses Final diagnoses:  Injury  Sprain of left ankle, unspecified ligament, initial encounter    Rx / DC Orders ED Discharge Orders    None       Claude Manges, PA-C 01/29/20 0140    Molpus, Jonny Ruiz, MD 01/29/20 [phone]

## 2020-01-29 NOTE — ED Triage Notes (Signed)
Patient here from home with complaints of left ankle pain after "stepping down stairs wrong".

## 2020-09-29 ENCOUNTER — Other Ambulatory Visit: Payer: Self-pay | Admitting: Nurse Practitioner

## 2020-09-29 DIAGNOSIS — Z1231 Encounter for screening mammogram for malignant neoplasm of breast: Secondary | ICD-10-CM

## 2021-07-26 ENCOUNTER — Other Ambulatory Visit: Payer: Self-pay

## 2021-07-26 DIAGNOSIS — Z1231 Encounter for screening mammogram for malignant neoplasm of breast: Secondary | ICD-10-CM

## 2021-08-09 ENCOUNTER — Other Ambulatory Visit: Payer: Self-pay

## 2021-08-09 ENCOUNTER — Ambulatory Visit
Admission: RE | Admit: 2021-08-09 | Discharge: 2021-08-09 | Disposition: A | Discharge status: Home / Self Care | Payer: No Typology Code available for payment source | Source: Ambulatory Visit | Attending: Family Medicine | Admitting: Family Medicine

## 2021-08-09 DIAGNOSIS — Z1231 Encounter for screening mammogram for malignant neoplasm of breast: Secondary | ICD-10-CM

## 2022-09-27 ENCOUNTER — Other Ambulatory Visit: Payer: Self-pay | Admitting: Family Medicine

## 2022-09-27 DIAGNOSIS — Z1231 Encounter for screening mammogram for malignant neoplasm of breast: Secondary | ICD-10-CM

## 2022-10-02 ENCOUNTER — Ambulatory Visit
Admission: RE | Admit: 2022-10-02 | Discharge: 2022-10-02 | Disposition: A | Discharge status: Home / Self Care | Payer: Medicaid Other | Source: Ambulatory Visit | Attending: Family Medicine | Admitting: Family Medicine

## 2022-10-02 DIAGNOSIS — Z1231 Encounter for screening mammogram for malignant neoplasm of breast: Secondary | ICD-10-CM

## 2022-10-25 ENCOUNTER — Encounter: Payer: Self-pay | Admitting: Gastroenterology

## 2022-11-13 ENCOUNTER — Ambulatory Visit (AMBULATORY_SURGERY_CENTER): Payer: Medicaid Other

## 2022-11-13 VITALS — Ht 64.0 in | Wt 255.0 lb

## 2022-11-13 DIAGNOSIS — Z1211 Encounter for screening for malignant neoplasm of colon: Secondary | ICD-10-CM

## 2022-11-13 MED ORDER — NA SULFATE-K SULFATE-MG SULF 17.5-3.13-1.6 GM/177ML PO SOLN: 1.0000 | Freq: Once | ORAL | 0 refills | Status: AC

## 2022-11-13 NOTE — Progress Notes (Signed)
Pre visit completed via phone call; Patient verified name, DOB, and address;  No egg or soy allergy known to patient;  No issues known to pt with past sedation with any surgeries or procedures; Patient denies ever being told they had issues or difficulty with intubation;  No FH of Malignant Hyperthermia; Pt is not on diet pills; Pt is not on home 02;  Pt is not on blood thinners;  Pt reports issues with constipation -patient reports she takes stool softeners if needed, eats as much fiber as she can; RN advised patient to increase oral fluids, fruits/veggies, and activity; also advised she can take Miralax if needed;  No A fib or A flutter; Have any cardiac testing pending--NO Pt instructed to use Singlecare.com or GoodRx for a price reduction on prep;   Insurance verified during [place] appt=[place] Medicaid Healthy Blue  Patient's chart reviewed by Osvaldo Angst CNRA prior to previsit and patient appropriate for the [place].  Previsit completed and red dot placed by patient's name on their procedure day (on provider's schedule);

## 2022-12-08 ENCOUNTER — Ambulatory Visit: Payer: Medicaid Other | Admitting: Gastroenterology

## 2022-12-08 ENCOUNTER — Encounter: Payer: Self-pay | Admitting: Gastroenterology

## 2022-12-08 VITALS — BP 148/95 | HR 67 | Temp 98.2°F | Resp 14 | Ht 65.0 in | Wt 255.0 lb

## 2022-12-08 DIAGNOSIS — K635 Polyp of colon: Secondary | ICD-10-CM | POA: Diagnosis not present

## 2022-12-08 DIAGNOSIS — Z1211 Encounter for screening for malignant neoplasm of colon: Secondary | ICD-10-CM

## 2022-12-08 DIAGNOSIS — D125 Benign neoplasm of sigmoid colon: Secondary | ICD-10-CM | POA: Diagnosis not present

## 2022-12-08 MED ORDER — SODIUM CHLORIDE 0.9 % IV SOLN: 500.0000 mL | Freq: Once | INTRAVENOUS | Status: DC

## 2022-12-08 MED ORDER — NA SULFATE-K SULFATE-MG SULF 17.5-3.13-1.6 GM/177ML PO SOLN: 1.0000 | Freq: Once | ORAL | 0 refills | Status: AC

## 2022-12-08 MED ORDER — ONDANSETRON HCL 4 MG PO TABS: 4.0000 mg | ORAL_TABLET | ORAL | 0 refills | Status: AC

## 2022-12-08 NOTE — Progress Notes (Signed)
Pt's states no medical or surgical changes since previsit or office visit. 

## 2022-12-08 NOTE — Op Note (Signed)
[facility] Patient Name: [name] Procedure Date: 12/08/2022 8:23 AM MRN: [id number] Endoscopist: Meryl DareMalcolm T Uri Turnbough , MD, [phone] Age: 47 Referring MD:  Date of Birth: 11/12/1975 Gender: Female Account #: [id number] Procedure:                Colonoscopy Indications:              Screening for colorectal malignant neoplasm Medicines:                Monitored Anesthesia Care Procedure:                Pre-Anesthesia Assessment:                           - Prior to the procedure, a History and Physical                            was performed, and patient medications and                            allergies were reviewed. The patient's tolerance of                            previous anesthesia was also reviewed. The risks                            and benefits of the procedure and the sedation                            options and risks were discussed with the patient.                            All questions were answered, and informed consent                            was obtained. Prior Anticoagulants: The patient has                            taken no anticoagulant or antiplatelet agents. ASA                            Grade Assessment: II - A patient with mild systemic                            disease. After reviewing the risks and benefits,                            the patient was deemed in satisfactory condition to                            undergo the procedure.                           After obtaining informed consent, the colonoscope  was passed under direct vision. Throughout the                            procedure, the patient's blood pressure, pulse, and                            oxygen saturations were monitored continuously. The                            CF [id number] #[id number] was introduced through the anus                            and advanced to the the cecum, identified by                            appendiceal  orifice and ileocecal valve. The                            ileocecal valve, appendiceal orifice, and rectum                            were photographed. The quality of the bowel                            preparation was inadequate. The colonoscopy was                            performed without difficulty. The patient tolerated                            the procedure well. Scope In: 8:34:22 AM Scope Out: 8:51:53 AM Scope Withdrawal Time: 0 hours 14 minutes 24 seconds  Total Procedure Duration: 0 hours 17 minutes 31 seconds  Findings:                 The perianal and digital rectal examinations were                            normal.                           A 6 mm polyp was found in the sigmoid colon. The                            polyp was sessile. The polyp was removed with a                            cold snare. Resection and retrieval were complete.                           A moderate amount of semi-solid stool was found in                            the entire colon, interfering with visualization.  The exam was otherwise without abnormality on                            direct and retroflexion views. Complications:            No immediate complications. Estimated blood loss:                            None. Estimated Blood Loss:     Estimated blood loss: none. Impression:               - Preparation of the colon was inadequate.                           - One 6 mm polyp in the sigmoid colon, removed with                            a cold snare. Resected and retrieved.                           - Stool in the entire examined colon.                           - The examination was otherwise normal on direct                            and retroflexion views. Recommendation:           - Repeat colonoscopy within 3 months because the                            bowel preparation was poor with an extended bowel                            prep.                            - Patient has a contact number available for                            emergencies. The signs and symptoms of potential                            delayed complications were discussed with the                            patient. Return to normal activities tomorrow.                            Written discharge instructions were provided to the                            patient.                           - Resume previous diet.                           -  Continue present medications.                           - Await pathology results. Meryl DareMalcolm T Seth Higginbotham, MD 12/08/2022 8:55:32 AM This report has been signed electronically.

## 2022-12-08 NOTE — Progress Notes (Signed)
Called to room to assist during endoscopic procedure.  Patient ID and intended procedure confirmed with present staff. Received instructions for my participation in the procedure from the performing physician.  

## 2022-12-08 NOTE — Patient Instructions (Addendum)
-  Handout on polyps provided -await pathology results -repeat colonoscopy  within 3 month because bowel preparation was poor with extended bowel prep.  -Continue present medications. Take Miralax 17 g daily. Follow mixing instructions on bottle. Can get this medication over the counter.   YOU HAD AN ENDOSCOPIC PROCEDURE TODAY AT THE [facility]:   Refer to the procedure report that was given to you for any specific questions about what was found during the examination.  If the procedure report does not answer your questions, please call your gastroenterologist to clarify.  If you requested that your care partner not be given the details of your procedure findings, then the procedure report has been included in a sealed envelope for you to review at your convenience later.  YOU SHOULD EXPECT: Some feelings of bloating in the abdomen. Passage of more gas than usual.  Walking can help get rid of the air that was put into your GI tract during the procedure and reduce the bloating. If you had a lower endoscopy (such as a colonoscopy or flexible sigmoidoscopy) you may notice spotting of blood in your stool or on the toilet paper. If you underwent a bowel prep for your procedure, you may not have a normal bowel movement for a few days.  Please Note:  You might notice some irritation and congestion in your nose or some drainage.  This is from the oxygen used during your procedure.  There is no need for concern and it should clear up in a day or so.  SYMPTOMS TO REPORT IMMEDIATELY:  Following lower endoscopy (colonoscopy or flexible sigmoidoscopy):  Excessive amounts of blood in the stool  Significant tenderness or worsening of abdominal pains  Swelling of the abdomen that is new, acute  Fever of 100F or higher   For urgent or emergent issues, a gastroenterologist can be reached at any hour by calling [phone]. Do not use MyChart messaging for urgent concerns.    DIET:  We do  recommend a small meal at first, but then you may proceed to your regular diet.  Drink plenty of fluids but you should avoid alcoholic beverages for 24 hours.  ACTIVITY:  You should plan to take it easy for the rest of today and you should NOT DRIVE or use heavy machinery until tomorrow (because of the sedation medicines used during the test).    FOLLOW UP: Our staff will call the number listed on your records the next business day following your procedure.  We will call around 7:15- 8:00 am to check on you and address any questions or concerns that you may have regarding the information given to you following your procedure. If we do not reach you, we will leave a message.     If any biopsies were taken you will be contacted by phone or by letter within the next 1-3 weeks.  Please call us at [phone] if you have not heard about the biopsies in 3 weeks.    SIGNATURES/CONFIDENTIALITY: You and/or your care partner have signed paperwork which will be entered into your electronic medical record.  These signatures attest to the fact that that the information above on your After Visit Summary has been reviewed and is understood.  Full responsibility of the confidentiality of this discharge information lies with you and/or your care-partner.

## 2022-12-08 NOTE — Progress Notes (Signed)
See 11/08/2022 H&P, no changes

## 2022-12-08 NOTE — Progress Notes (Signed)
A and O x3. Report to RN. Tolerated MAC anesthesia well. 

## 2022-12-09 ENCOUNTER — Encounter (HOSPITAL_BASED_OUTPATIENT_CLINIC_OR_DEPARTMENT_OTHER): Payer: Self-pay

## 2022-12-09 DIAGNOSIS — R5383 Other fatigue: Secondary | ICD-10-CM

## 2022-12-11 ENCOUNTER — Telehealth: Payer: Self-pay

## 2022-12-11 NOTE — Telephone Encounter (Signed)
  Follow up Call-     12/08/2022    7:44 AM  Call back number  Post procedure Call Back phone  # [phone]  Permission to leave phone message Yes     Left message

## 2022-12-14 ENCOUNTER — Encounter: Payer: Self-pay | Admitting: Gastroenterology

## 2023-01-09 ENCOUNTER — Ambulatory Visit (HOSPITAL_BASED_OUTPATIENT_CLINIC_OR_DEPARTMENT_OTHER): Payer: Medicaid Other | Attending: Nurse Practitioner | Admitting: Internal Medicine

## 2023-01-09 VITALS — Ht 64.0 in | Wt 258.0 lb

## 2023-01-09 DIAGNOSIS — R5383 Other fatigue: Secondary | ICD-10-CM | POA: Diagnosis present

## 2023-01-09 DIAGNOSIS — G4733 Obstructive sleep apnea (adult) (pediatric): Secondary | ICD-10-CM

## 2023-01-21 DIAGNOSIS — R5383 Other fatigue: Secondary | ICD-10-CM

## 2023-01-21 NOTE — Procedures (Signed)
   Patient Name: [name], [name] Date: 01/09/2023 Gender: Female D.O.B: Jun 05, 1976 Age (years): 4 Referring Provider: Lance Bosch NP Height (inches): 64 Interpreting Physician: Jetty Duhamel MD, ABSM Weight (lbs): 258 RPSGT: Ulyess Mort BMI: 44 MRN: [id number] Neck Size: 15.50  CLINICAL INFORMATION Sleep Study Type: NPSG Indication for sleep study: Daytime Fatigue, Depression, Fatigue, Obesity, Snoring Epworth Sleepiness Score: 7  SLEEP STUDY TECHNIQUE As per the AASM Manual for the Scoring of Sleep and Associated Events v2.3 (April 2016) with a hypopnea requiring 4% desaturations.  The channels recorded and monitored were frontal, central and occipital EEG, electrooculogram (EOG), submentalis EMG (chin), nasal and oral airflow, thoracic and abdominal wall motion, anterior tibialis EMG, snore microphone, electrocardiogram, and pulse oximetry.  MEDICATIONS Medications self-administered by patient taken the night of the study : SINGULAIR, veozah  SLEEP ARCHITECTURE The study was initiated at 10:11:17 PM and ended at 4:37:21 AM.  Sleep onset time was 16.2 minutes and the sleep efficiency was 89.2%. The total sleep time was 344.3 minutes.  Stage REM latency was 96.0 minutes.  The patient spent 13.5% of the night in stage N1 sleep, 72.7% in stage N2 sleep, 0.0% in stage N3 and 13.8% in REM.  Alpha intrusion was absent.  Supine sleep was 36.04%.  RESPIRATORY PARAMETERS The overall apnea/hypopnea index (AHI) was 10.1 per hour. There were 2 total apneas, including 2 obstructive, 0 central and 0 mixed apneas. There were 56 hypopneas and 57 RERAs.  The AHI during Stage REM sleep was 56.8 per hour.  AHI while supine was 18.4 per hour.  The mean oxygen saturation was 92.0%. The minimum SpO2 during sleep was 79.0%.  moderate snoring was noted during this study.  CARDIAC DATA The 2 lead EKG demonstrated sinus rhythm. The mean heart rate was 61.6 beats per minute.  Other EKG findings include: PVCs.  LEG MOVEMENT DATA The total PLMS were 0 with a resulting PLMS index of 0.0. Associated arousal with leg movement index was 0.0 .  IMPRESSIONS - Mild obstructive sleep apnea occurred during this study (AHI = 10.1/h). - Moderate oxygen desaturation was noted during this study (Min O2 = 79.0%, Mean 92%). - The patient snored with moderate snoring volume. - EKG findings include PVCs, PACs. - Clinically significant periodic limb movements did not occur during sleep. No significant associated arousals.  DIAGNOSIS - Obstructive Sleep Apnea (G47.33)  RECOMMENDATIONS - Suggest CPAP titration sleep study, autopap, a fitted oral appliance or ENT evaluation. - Additional management for Insomnia may be helpful, based on her history.. - Positional therapy avoiding supine position during sleep. - Sleep hygiene should be reviewed to assess factors that may improve sleep quality. - Weight management and regular exercise should be initiated or continued if appropriate.  [Electronically signed] 01/21/2023 11:39 AM  Jetty Duhamel MD, ABSM Diplomate, American Board of Sleep Medicine NPI: [id number]                          Jetty Duhamel Diplomate, American Board of Sleep Medicine  ELECTRONICALLY SIGNED ON:  01/21/2023, 11:34 AM  [facility] PH: [phone]   FX: [phone] ACCREDITED BY THE AMERICAN ACADEMY OF SLEEP MEDICINE

## 2023-01-30 ENCOUNTER — Encounter: Payer: No Typology Code available for payment source | Admitting: Gastroenterology

## 2023-03-13 ENCOUNTER — Encounter: Payer: No Typology Code available for payment source | Admitting: Gastroenterology

## 2023-03-13 DIAGNOSIS — N393 Stress incontinence (female) (male): Secondary | ICD-10-CM | POA: Insufficient documentation

## 2023-08-06 ENCOUNTER — Telehealth (INDEPENDENT_AMBULATORY_CARE_PROVIDER_SITE_OTHER): Payer: Medicaid Other | Admitting: Nurse Practitioner

## 2023-08-06 DIAGNOSIS — E7849 Other hyperlipidemia: Secondary | ICD-10-CM | POA: Diagnosis not present

## 2023-08-06 DIAGNOSIS — Z6841 Body Mass Index (BMI) 40.0 and over, adult: Secondary | ICD-10-CM

## 2023-08-06 DIAGNOSIS — E785 Hyperlipidemia, unspecified: Secondary | ICD-10-CM | POA: Insufficient documentation

## 2023-08-06 DIAGNOSIS — G4730 Sleep apnea, unspecified: Secondary | ICD-10-CM | POA: Insufficient documentation

## 2023-08-06 DIAGNOSIS — G4733 Obstructive sleep apnea (adult) (pediatric): Secondary | ICD-10-CM

## 2023-08-06 NOTE — Progress Notes (Signed)
Office: [phone]  /  Fax: [phone]   Initial Visit TeleHealth Visit:  This visit was completed with telemedicine (audio/video) technology. [name] has verbally consented to this TeleHealth visit. The patient is located at home, the provider is located at [facility] at [place].   The participants in this visit include the listed provider and patient. The visit was conducted today via MyChart video.   [name] was seen via video visit today to evaluate for obesity. She is interested in losing weight to improve overall health and reduce the risk of weight related complications. She presents today to review program treatment options, initial physical assessment, and evaluation.     She was referred by: PCP  When asked what else they would like to accomplish? She states: Adopt healthier eating patterns, Improve existing medical conditions, and Improve quality of life.  Goal weight: < 200 lbs.  Felt good at 180 lbs     When asked how has your weight affected you? She states: Contributed to orthopedic problems or mobility issues, Having fatigue, and Having poor endurance  Some associated conditions: HLD, chronic back pain, OSAS, migraines, SUI  Contributing factors: Family history of obesity, Use of obesogenic medications: Contraceptives or hormonal therapy, Reduced physical activity, Eating patterns, and Menopause  Weight promoting medications identified: Contraceptives or hormonal therapy  Current nutrition plan: Has reduced fried foods.  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 She was weighed on the bioimpedance scale: There is no height or weight on file to calculate BMI.  Peak Weight:274 lbs (home scale-today's weight) ,Weight trend over the last 12 months: Increasing  General:  Alert, oriented and cooperative. Patient is in no acute distress.  Respiratory: Normal respiratory effort, no problems with respiration noted   Gait: able to ambulate independently  Mental Status: Normal mood and affect. Normal behavior. Normal judgment and thought content.   DIAGNOSTIC DATA REVIEWED:  BMET    Component Value Date/Time   NA 139 04/18/2018 2032   K 4.1 04/18/2018 2032   CL 105 04/18/2018 2032   CO2 25 04/18/2018 2032   GLUCOSE 106 (H) 04/18/2018 2032   BUN 15 04/18/2018 2032   CREATININE 0.70 04/18/2018 2032   CALCIUM 9.1 04/18/2018 2032   GFRNONAA >60 04/18/2018 2032   GFRAA >60 04/18/2018 2032   No results found for: "HGBA1C" No results found for: "INSULIN" CBC    Component Value Date/Time   WBC 9.9 04/18/2018 2032   RBC 4.09 04/18/2018 2032   HGB 13.1 04/18/2018 2032   HCT 38.7 04/18/2018 2032   PLT 336 04/18/2018 2032   MCV 94.6 04/18/2018 2032   MCH 32.0 04/18/2018 2032   MCHC 33.9 04/18/2018 2032  RDW 12.9 04/18/2018 2032   Iron/TIBC/Ferritin/ %Sat No results found for: "IRON", "TIBC", "FERRITIN", "IRONPCTSAT" Lipid Panel  No results found for: "CHOL", "TRIG", "HDL", "CHOLHDL", "VLDL", "LDLCALC", "LDLDIRECT" Hepatic Function Panel     Component Value Date/Time   PROT 7.0 01/03/2011 2310   ALBUMIN 3.6 01/03/2011 2310   AST 13 01/03/2011 2310   ALT 18 01/03/2011 2310   ALKPHOS 45 01/03/2011 2310   BILITOT 0.1 (L) 01/03/2011 2310   No results found  for: "TSH"   Assessment and Plan:   Other hyperlipidemia Continue to follow up with PCP.  Continue meds as directed  Obstructive sleep apnea syndrome Continue to follow up with PCP and Dr. Maple Hudson. Saw Dr. Maple Hudson last on 01/09/23.    Morbid obesity (HCC)  BMI 40.0-44.9, adult (HCC)        Obesity Treatment / Action Plan:  Patient will work on garnering support from family and friends to begin weight loss journey. Will work on eliminating or reducing the presence of highly palatable, calorie dense foods in the home. Will complete provided nutritional and psychosocial assessment questionnaire before the next appointment. Will be scheduled for indirect calorimetry to determine resting energy expenditure in a fasting state.  This will allow [place] to create a reduced calorie, high-protein meal plan to promote loss of fat mass while preserving muscle mass. Counseled on the health benefits of losing 5%-15% of total body weight. Was counseled on nutritional approaches to weight loss and benefits of reducing processed foods and consuming plant-based foods and high quality protein as part of nutritional weight management. Was counseled on pharmacotherapy and role as an adjunct in weight management.   Obesity Education Performed Today:  She was weighed on the bioimpedance scale and results were discussed and documented in the synopsis.  We discussed obesity as a disease and the importance of a more detailed evaluation of all the factors contributing to the disease.  We discussed the importance of long term lifestyle changes which include nutrition, exercise and behavioral modifications as well as the importance of customizing this to her specific health and social needs.  We discussed the benefits of reaching a healthier weight to alleviate the symptoms of existing conditions and reduce the risks of the biomechanical, metabolic and psychological effects of obesity.  [name] appears to  be in the action stage of change and states they are ready to start intensive lifestyle modifications and behavioral modifications.  30 minutes was spent today on this visit including the above counseling, pre-visit chart review, and post-visit documentation.  Reviewed by clinician on day of visit: allergies, medications, problem list, medical history, surgical history, family history, social history, and previous encounter notes pertinent to obesity diagnosis.    Theodis Sato Yuko [employer] FNP-C

## 2023-09-26 IMAGING — MG MM DIGITAL SCREENING BILAT W/ TOMO AND CAD
6 of 10 series · 6 of 30 positions shown · non-contrast
Comparison: Previous exam(s).

CLINICAL DATA: Screening.

EXAM:
DIGITAL SCREENING BILATERAL MAMMOGRAM WITH TOMOSYNTHESIS AND CAD
TECHNIQUE: Bilateral screening digital craniocaudal and mediolateral oblique
mammograms were obtained. Bilateral screening digital breast
tomosynthesis was performed. The images were evaluated with
computer-aided detection.

[L CC synth-2D (1 of 2)]
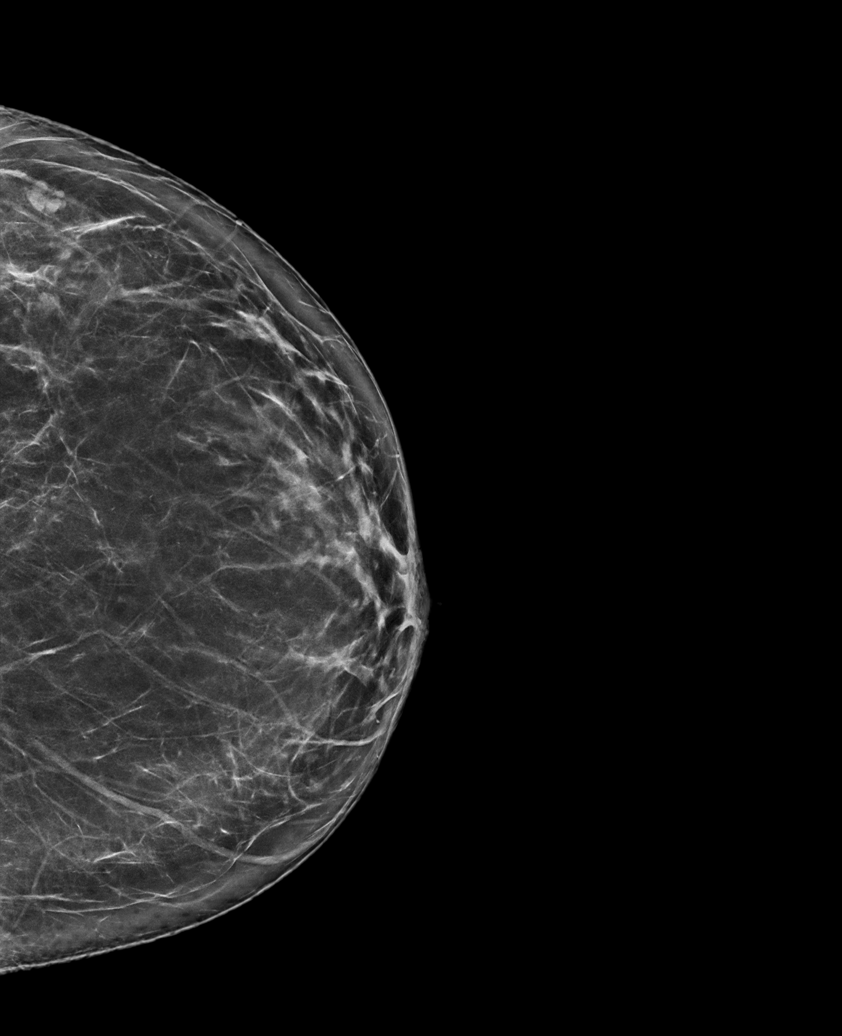

[R MLO synth-2D]
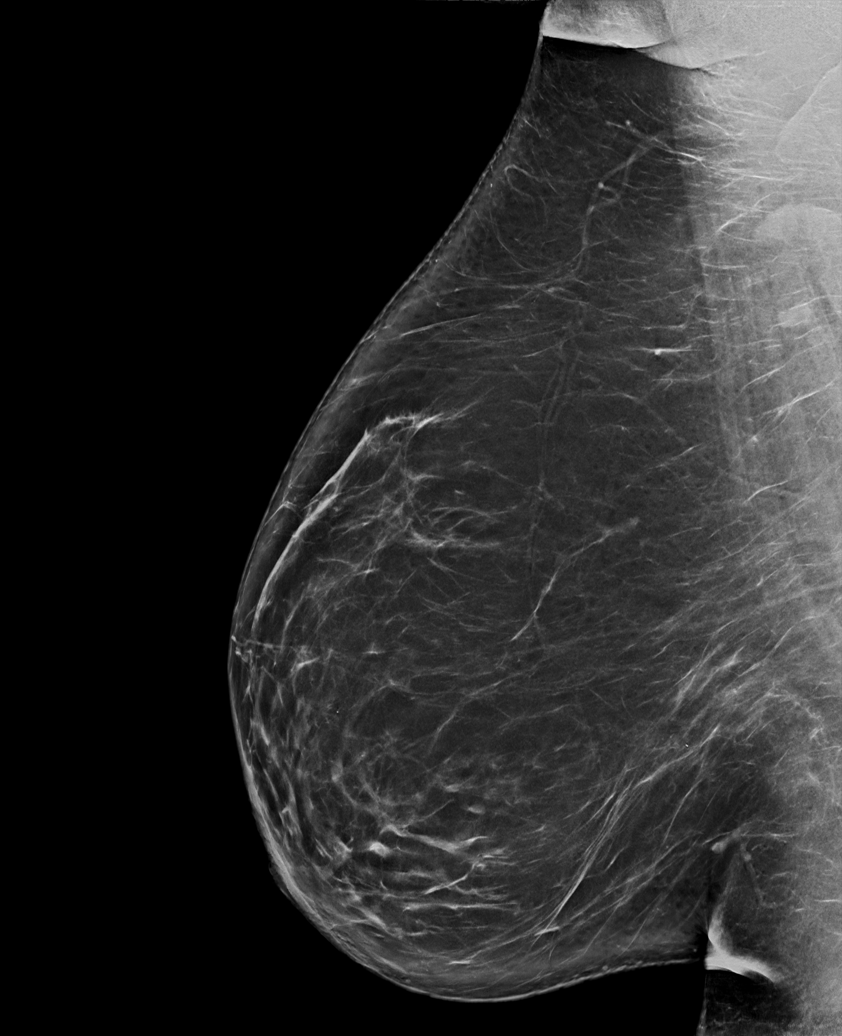

[R CC synth-2D]
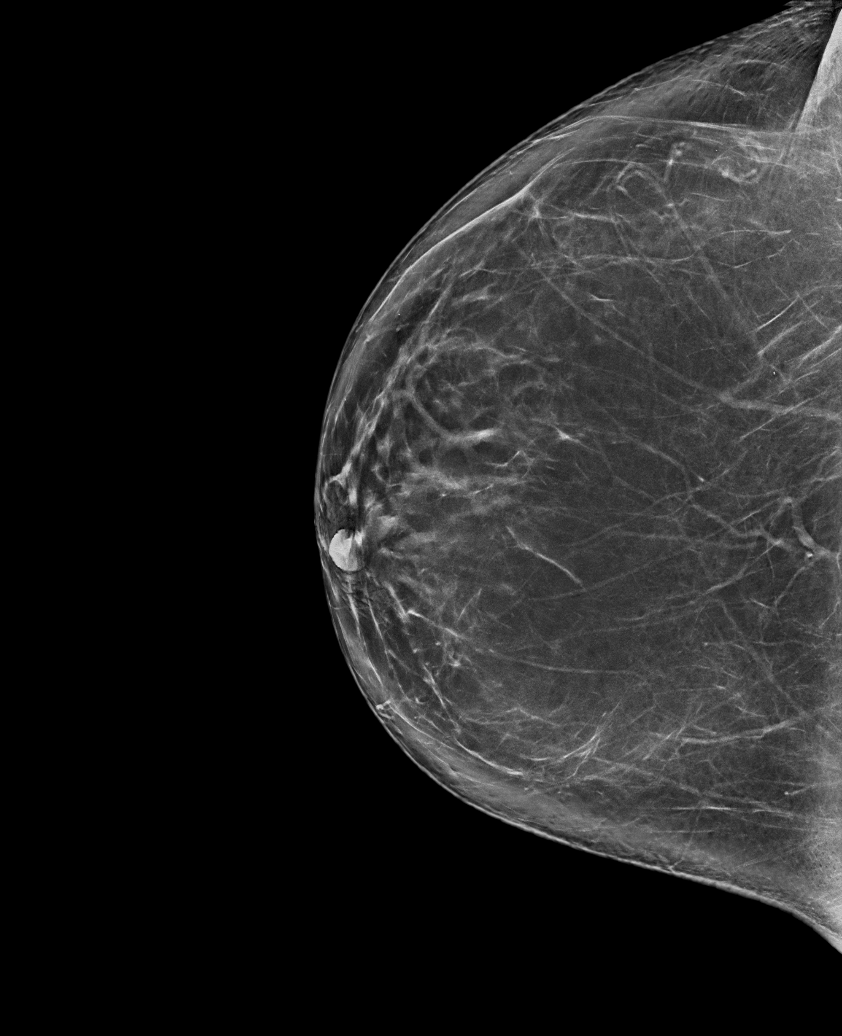

[L CC synth-2D (2 of 2)]
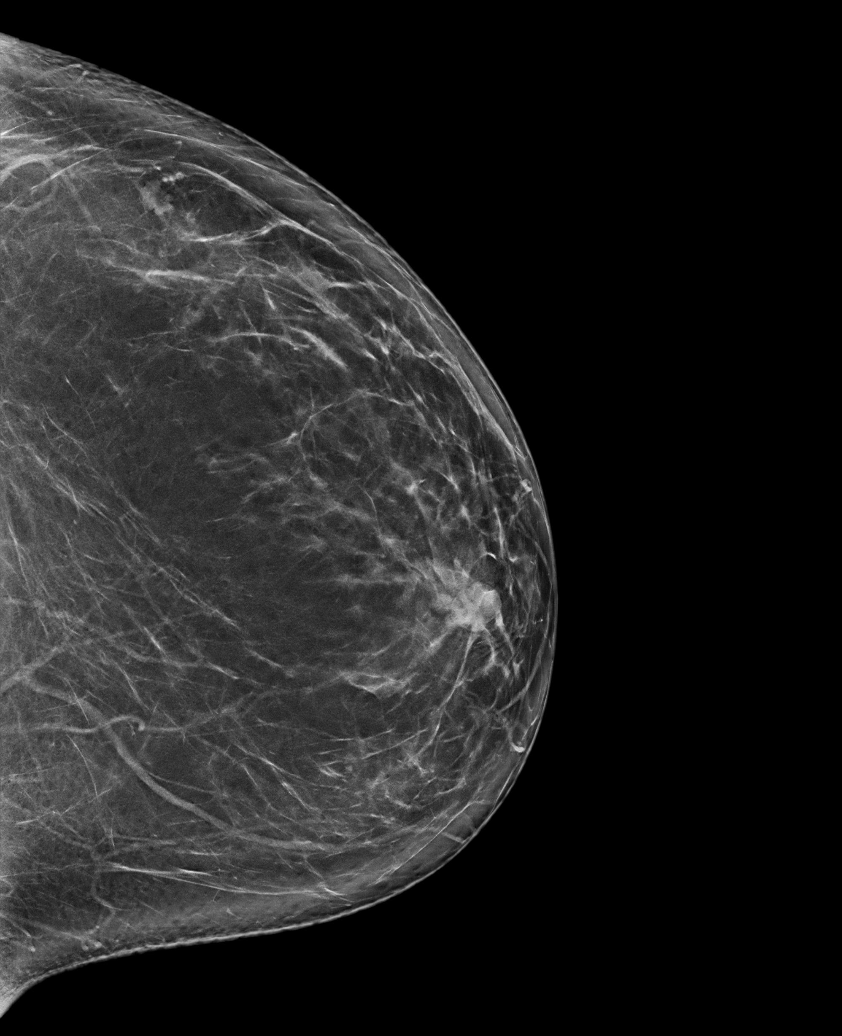

[L MLO synth-2D]
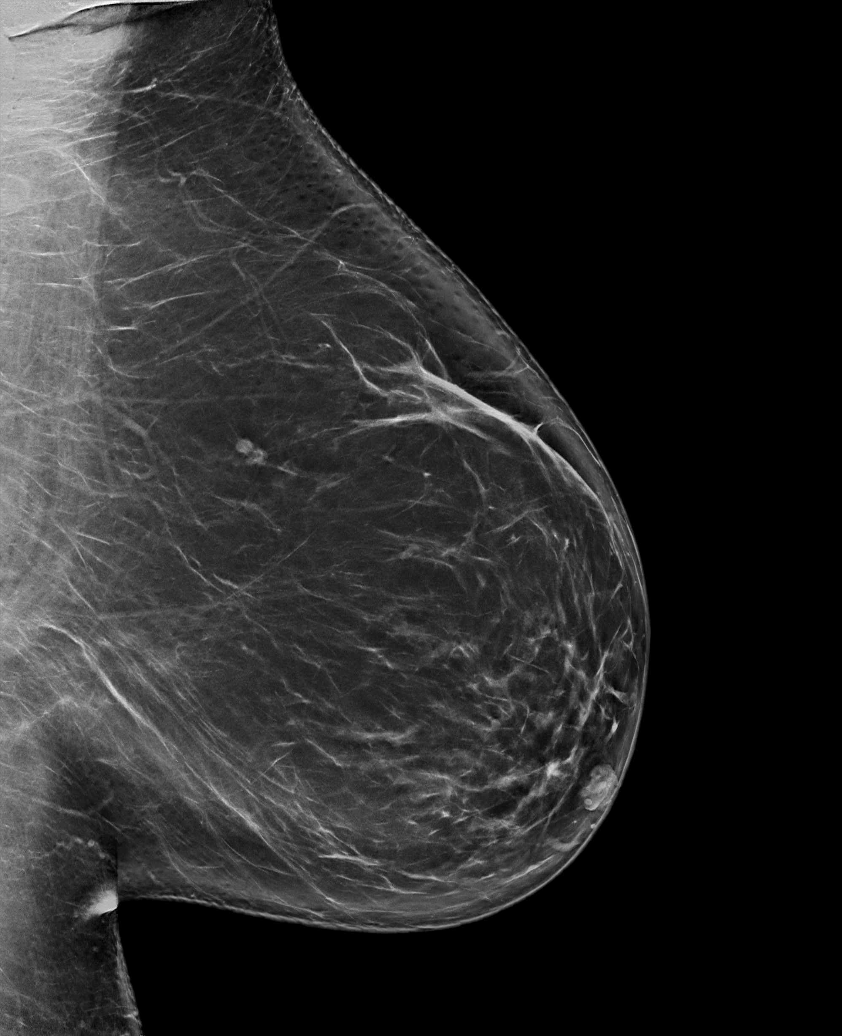

[L CC tomo · tomo slice 45/90.0]
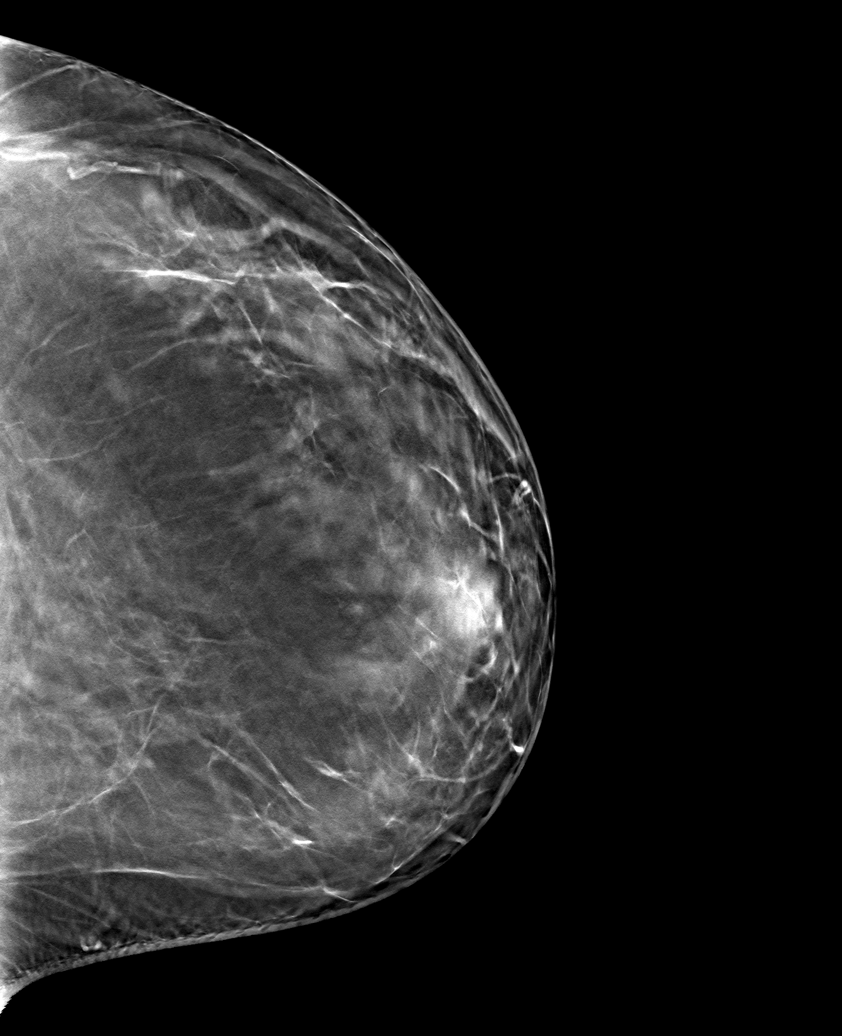

[6 of 30 positions shown; findings below may reference images not displayed]

ACR Breast Density Category b: There are scattered areas of
fibroglandular density.
FINDINGS: There are no findings suspicious for malignancy.
IMPRESSION: No mammographic evidence of malignancy. A result letter of this
screening mammogram will be mailed directly to the patient.

RECOMMENDATION:
Screening mammogram in one year. (Code:51-O-LD2)

BI-RADS CATEGORY  1: Negative.

## 2024-01-24 ENCOUNTER — Other Ambulatory Visit: Payer: Self-pay | Admitting: Family Medicine

## 2024-01-24 DIAGNOSIS — Z0000 Encounter for general adult medical examination without abnormal findings: Secondary | ICD-10-CM

## 2024-01-25 ENCOUNTER — Other Ambulatory Visit: Payer: Self-pay | Admitting: Nurse Practitioner

## 2024-01-25 DIAGNOSIS — N644 Mastodynia: Secondary | ICD-10-CM

## 2024-01-25 DIAGNOSIS — N643 Galactorrhea not associated with childbirth: Secondary | ICD-10-CM

## 2024-02-05 ENCOUNTER — Other Ambulatory Visit

## 2024-02-05 ENCOUNTER — Encounter

## 2024-02-14 ENCOUNTER — Ambulatory Visit
Admission: RE | Admit: 2024-02-14 | Discharge: 2024-02-14 | Disposition: A | Discharge status: Home / Self Care | Source: Ambulatory Visit | Attending: Nurse Practitioner | Admitting: Nurse Practitioner

## 2024-02-14 ENCOUNTER — Ambulatory Visit

## 2024-02-14 DIAGNOSIS — N643 Galactorrhea not associated with childbirth: Secondary | ICD-10-CM

## 2024-02-14 DIAGNOSIS — N644 Mastodynia: Secondary | ICD-10-CM

## 2024-02-19 ENCOUNTER — Other Ambulatory Visit: Payer: Self-pay | Admitting: Nurse Practitioner

## 2024-02-19 DIAGNOSIS — N6452 Nipple discharge: Secondary | ICD-10-CM

## 2024-03-20 ENCOUNTER — Ambulatory Visit
Admission: RE | Admit: 2024-03-20 | Discharge: 2024-03-20 | Disposition: A | Discharge status: Home / Self Care | Source: Ambulatory Visit | Attending: Nurse Practitioner | Admitting: Nurse Practitioner

## 2024-03-20 DIAGNOSIS — N6452 Nipple discharge: Secondary | ICD-10-CM

## 2024-03-20 MED ORDER — GADOPICLENOL 0.5 MMOL/ML IV SOLN
10.0000 mL | Freq: Once | INTRAVENOUS | Status: AC | PRN
  Administered 2024-03-20: 10 mL via INTRAVENOUS

## 2024-04-03 ENCOUNTER — Ambulatory Visit: Payer: Self-pay | Admitting: General Surgery

## 2024-04-14 ENCOUNTER — Encounter (HOSPITAL_BASED_OUTPATIENT_CLINIC_OR_DEPARTMENT_OTHER): Payer: Self-pay | Admitting: General Surgery

## 2024-04-14 ENCOUNTER — Other Ambulatory Visit: Payer: Self-pay

## 2024-04-15 MED ORDER — CHLORHEXIDINE GLUCONATE CLOTH 2 % EX PADS: 6.0000 | MEDICATED_PAD | Freq: Once | CUTANEOUS | Status: DC

## 2024-04-15 NOTE — Progress Notes (Signed)

## 2024-04-20 NOTE — Anesthesia Preprocedure Evaluation (Signed)
 Anesthesia Evaluation  Patient identified by MRN, date of birth, ID band Patient awake    Reviewed: Allergy & Precautions, H&P , NPO status , Patient's Chart, lab work & pertinent test results  History of Anesthesia Complications Negative for: history of anesthetic complications  Airway Mallampati: III  TM Distance: >3 FB Neck ROM: Full    Dental no notable dental hx. (+) Dental Advisory Given   Pulmonary asthma , sleep apnea , former smoker   Pulmonary exam normal breath sounds clear to auscultation       Cardiovascular negative cardio ROS Normal cardiovascular exam Rhythm:Regular Rate:Normal     Neuro/Psych  Headaches PSYCHIATRIC DISORDERS Anxiety Depression Bipolar Disorder      GI/Hepatic Neg liver ROS,GERD  ,,  Endo/Other    Class 3 obesity  Renal/GU negative Renal ROS     Musculoskeletal  (+) Arthritis ,  Fibromyalgia -  Abdominal  (+) + obese  Peds  Hematology negative hematology ROS (+)   Anesthesia Other Findings   Reproductive/Obstetrics                              Anesthesia Physical Anesthesia Plan  ASA: 3  Anesthesia Plan: General   Post-op Pain Management: Tylenol  PO (pre-op)* and Gabapentin  PO (pre-op)*   Induction: Intravenous  PONV Risk Score and Plan: 3 and Midazolam , Treatment may vary due to age or medical condition, Ondansetron  and Dexamethasone   Airway Management Planned: LMA  Additional Equipment:   Intra-op Plan:   Post-operative Plan: Extubation in OR  Informed Consent: I have reviewed the patients History and Physical, chart, labs and discussed the procedure including the risks, benefits and alternatives for the proposed anesthesia with the patient or authorized representative who has indicated his/her understanding and acceptance.     Dental advisory given  Plan Discussed with: CRNA  Anesthesia Plan Comments: (Rec multimodal analgesia with  acetaminophen , intraop opioid and preincision ketamine )         Anesthesia Quick Evaluation

## 2024-04-21 ENCOUNTER — Encounter (HOSPITAL_BASED_OUTPATIENT_CLINIC_OR_DEPARTMENT_OTHER): Payer: Self-pay | Admitting: General Surgery

## 2024-04-21 ENCOUNTER — Other Ambulatory Visit: Payer: Self-pay

## 2024-04-21 ENCOUNTER — Encounter (HOSPITAL_BASED_OUTPATIENT_CLINIC_OR_DEPARTMENT_OTHER)
Admission: RE | Disposition: A | Discharge status: Home / Self Care | Payer: Self-pay | Source: Home / Self Care | Attending: General Surgery

## 2024-04-21 ENCOUNTER — Ambulatory Visit (HOSPITAL_BASED_OUTPATIENT_CLINIC_OR_DEPARTMENT_OTHER): Payer: Self-pay | Admitting: Anesthesiology

## 2024-04-21 ENCOUNTER — Ambulatory Visit (HOSPITAL_BASED_OUTPATIENT_CLINIC_OR_DEPARTMENT_OTHER)
Admission: RE | Admit: 2024-04-21 | Discharge: 2024-04-21 | Disposition: A | Discharge status: Home / Self Care | Attending: General Surgery | Admitting: General Surgery

## 2024-04-21 DIAGNOSIS — N6042 Mammary duct ectasia of left breast: Secondary | ICD-10-CM | POA: Insufficient documentation

## 2024-04-21 DIAGNOSIS — Z87891 Personal history of nicotine dependence: Secondary | ICD-10-CM | POA: Insufficient documentation

## 2024-04-21 DIAGNOSIS — K219 Gastro-esophageal reflux disease without esophagitis: Secondary | ICD-10-CM | POA: Insufficient documentation

## 2024-04-21 DIAGNOSIS — Z803 Family history of malignant neoplasm of breast: Secondary | ICD-10-CM | POA: Insufficient documentation

## 2024-04-21 DIAGNOSIS — E66813 Obesity, class 3: Secondary | ICD-10-CM | POA: Diagnosis not present

## 2024-04-21 DIAGNOSIS — G4730 Sleep apnea, unspecified: Secondary | ICD-10-CM | POA: Diagnosis not present

## 2024-04-21 DIAGNOSIS — N6452 Nipple discharge: Secondary | ICD-10-CM

## 2024-04-21 DIAGNOSIS — F418 Other specified anxiety disorders: Secondary | ICD-10-CM | POA: Diagnosis not present

## 2024-04-21 DIAGNOSIS — L918 Other hypertrophic disorders of the skin: Secondary | ICD-10-CM | POA: Diagnosis not present

## 2024-04-21 DIAGNOSIS — Z01818 Encounter for other preprocedural examination: Secondary | ICD-10-CM

## 2024-04-21 DIAGNOSIS — J45909 Unspecified asthma, uncomplicated: Secondary | ICD-10-CM | POA: Insufficient documentation

## 2024-04-21 DIAGNOSIS — Z6841 Body Mass Index (BMI) 40.0 and over, adult: Secondary | ICD-10-CM | POA: Insufficient documentation

## 2024-04-21 HISTORY — PX: RE-EXCISION OF BREAST LUMPECTOMY: SHX6048

## 2024-04-21 HISTORY — DX: Unspecified asthma, uncomplicated: J45.909

## 2024-04-21 HISTORY — PX: EXCISION OF SKIN TAG: SHX6270

## 2024-04-21 SURGERY — EXCISION, LESION, BREAST
Anesthesia: General | Site: Breast | Laterality: Left

## 2024-04-21 MED ORDER — MIDAZOLAM HCL 2 MG/2ML IJ SOLN
INTRAMUSCULAR | Status: DC | PRN
  Administered 2024-04-21: 2 mg via INTRAVENOUS

## 2024-04-21 MED ORDER — OXYCODONE HCL 5 MG/5ML PO SOLN: 5.0000 mg | Freq: Once | ORAL | Status: DC | PRN

## 2024-04-21 MED ORDER — MIDAZOLAM HCL 2 MG/2ML IJ SOLN
INTRAMUSCULAR | Status: AC
  Filled 2024-04-21: qty 2

## 2024-04-21 MED ORDER — DEXAMETHASONE SODIUM PHOSPHATE 10 MG/ML IJ SOLN
INTRAMUSCULAR | Status: DC | PRN
  Administered 2024-04-21: 10 mg via INTRAVENOUS

## 2024-04-21 MED ORDER — ONDANSETRON HCL 4 MG/2ML IJ SOLN
INTRAMUSCULAR | Status: DC | PRN
  Administered 2024-04-21: 4 mg via INTRAVENOUS

## 2024-04-21 MED ORDER — ACETAMINOPHEN 500 MG PO TABS
1000.0000 mg | ORAL_TABLET | Freq: Once | ORAL | Status: AC
  Administered 2024-04-21: 1000 mg via ORAL

## 2024-04-21 MED ORDER — ACETAMINOPHEN 500 MG PO TABS
ORAL_TABLET | ORAL | Status: AC
  Filled 2024-04-21: qty 2

## 2024-04-21 MED ORDER — LACTATED RINGERS IV SOLN: INTRAVENOUS | Status: DC

## 2024-04-21 MED ORDER — BUPIVACAINE-EPINEPHRINE (PF) 0.25% -1:200000 IJ SOLN
INTRAMUSCULAR | Status: AC
  Filled 2024-04-21: qty 30

## 2024-04-21 MED ORDER — VANCOMYCIN HCL IN DEXTROSE 1-5 GM/200ML-% IV SOLN
1000.0000 mg | INTRAVENOUS | Status: AC
  Administered 2024-04-21 (×2): 1000 mg via INTRAVENOUS

## 2024-04-21 MED ORDER — ACETAMINOPHEN 500 MG PO TABS: 1000.0000 mg | ORAL_TABLET | ORAL | Status: DC

## 2024-04-21 MED ORDER — HYDROMORPHONE HCL 1 MG/ML IJ SOLN
0.2500 mg | INTRAMUSCULAR | Status: DC | PRN
  Administered 2024-04-21: 0.5 mg via INTRAVENOUS

## 2024-04-21 MED ORDER — VANCOMYCIN HCL IN DEXTROSE 1-5 GM/200ML-% IV SOLN
INTRAVENOUS | Status: AC
  Filled 2024-04-21: qty 200

## 2024-04-21 MED ORDER — GABAPENTIN 300 MG PO CAPS
ORAL_CAPSULE | ORAL | Status: AC
Start: 2024-04-21 — End: 2024-04-21
  Filled 2024-04-21: qty 1

## 2024-04-21 MED ORDER — LIDOCAINE 2% (20 MG/ML) 5 ML SYRINGE
INTRAMUSCULAR | Status: AC
  Filled 2024-04-21: qty 5

## 2024-04-21 MED ORDER — EPHEDRINE SULFATE-NACL 50-0.9 MG/10ML-% IV SOSY
PREFILLED_SYRINGE | INTRAVENOUS | Status: DC | PRN
  Administered 2024-04-21 (×2): 10 mg via INTRAVENOUS

## 2024-04-21 MED ORDER — GABAPENTIN 300 MG PO CAPS: 300.0000 mg | ORAL_CAPSULE | Freq: Once | ORAL | Status: DC

## 2024-04-21 MED ORDER — 0.9 % SODIUM CHLORIDE (POUR BTL) OPTIME
TOPICAL | Status: DC | PRN
  Administered 2024-04-21: 120 mL

## 2024-04-21 MED ORDER — OXYCODONE HCL 5 MG PO TABS: 5.0000 mg | ORAL_TABLET | Freq: Once | ORAL | Status: DC | PRN

## 2024-04-21 MED ORDER — KETAMINE HCL 50 MG/5ML IJ SOSY
PREFILLED_SYRINGE | INTRAMUSCULAR | Status: AC
Start: 2024-04-21 — End: 2024-04-21
  Filled 2024-04-21: qty 5

## 2024-04-21 MED ORDER — OXYCODONE HCL 5 MG PO TABS: 5.0000 mg | ORAL_TABLET | Freq: Four times a day (QID) | ORAL | 0 refills | Status: AC | PRN

## 2024-04-21 MED ORDER — LIDOCAINE 2% (20 MG/ML) 5 ML SYRINGE
INTRAMUSCULAR | Status: DC | PRN
  Administered 2024-04-21: 100 mg via INTRAVENOUS

## 2024-04-21 MED ORDER — LIDOCAINE-EPINEPHRINE 1 %-1:100000 IJ SOLN
INTRAMUSCULAR | Status: AC
  Filled 2024-04-21: qty 1

## 2024-04-21 MED ORDER — BUPIVACAINE-EPINEPHRINE 0.25% -1:200000 IJ SOLN
INTRAMUSCULAR | Status: DC | PRN
  Administered 2024-04-21: 20 mL

## 2024-04-21 MED ORDER — TRIPLE ANTIBIOTIC 3.5-400-5000 EX OINT
TOPICAL_OINTMENT | CUTANEOUS | Status: DC | PRN
  Administered 2024-04-21: 1 via TOPICAL

## 2024-04-21 MED ORDER — FENTANYL CITRATE (PF) 100 MCG/2ML IJ SOLN
INTRAMUSCULAR | Status: AC
  Filled 2024-04-21: qty 2

## 2024-04-21 MED ORDER — FENTANYL CITRATE (PF) 100 MCG/2ML IJ SOLN
INTRAMUSCULAR | Status: DC | PRN
  Administered 2024-04-21 (×2): 50 ug via INTRAVENOUS

## 2024-04-21 MED ORDER — PROPOFOL 10 MG/ML IV BOLUS
INTRAVENOUS | Status: DC | PRN
  Administered 2024-04-21: 200 mg via INTRAVENOUS

## 2024-04-21 MED ORDER — KETAMINE HCL 10 MG/ML IJ SOLN
INTRAMUSCULAR | Status: DC | PRN
Start: 2024-04-21 — End: 2024-04-21
  Administered 2024-04-21: 30 mg via INTRAVENOUS

## 2024-04-21 MED ORDER — DROPERIDOL 2.5 MG/ML IJ SOLN: 0.6250 mg | Freq: Once | INTRAMUSCULAR | Status: DC | PRN

## 2024-04-21 MED ORDER — HYDROMORPHONE HCL 1 MG/ML IJ SOLN
INTRAMUSCULAR | Status: AC
  Filled 2024-04-21: qty 0.5

## 2024-04-21 MED ORDER — GABAPENTIN 100 MG PO CAPS: 100.0000 mg | ORAL_CAPSULE | ORAL | Status: DC

## 2024-04-21 MED ORDER — PROPOFOL 500 MG/50ML IV EMUL
INTRAVENOUS | Status: AC
  Filled 2024-04-21: qty 50

## 2024-04-21 SURGICAL SUPPLY — 33 items
BLADE SURG 15 STRL LF DISP TIS (BLADE) ×1 IMPLANT
CANISTER SUCT 1200ML W/VALVE (MISCELLANEOUS) ×1 IMPLANT
CHLORAPREP W/TINT 26 (MISCELLANEOUS) ×1 IMPLANT
CLIP APPLIE 9.375 MED OPEN (MISCELLANEOUS) IMPLANT
COVER BACK TABLE 60X90IN (DRAPES) ×1 IMPLANT
COVER MAYO STAND STRL (DRAPES) ×1 IMPLANT
DERMABOND ADVANCED .7 DNX12 (GAUZE/BANDAGES/DRESSINGS) ×1 IMPLANT
DRAPE LAPAROSCOPIC ABDOMINAL (DRAPES) ×1 IMPLANT
DRAPE UTILITY XL STRL (DRAPES) ×1 IMPLANT
DRSG TELFA 3X8 NADH STRL (GAUZE/BANDAGES/DRESSINGS) IMPLANT
ELECT COATED BLADE 2.86 ST (ELECTRODE) ×1 IMPLANT
ELECTRODE REM PT RTRN 9FT ADLT (ELECTROSURGICAL) ×1 IMPLANT
GLOVE BIO SURGEON STRL SZ7.5 (GLOVE) ×1 IMPLANT
GOWN STRL REUS W/ TWL LRG LVL3 (GOWN DISPOSABLE) ×2 IMPLANT
KIT MARKER MARGIN INK (KITS) IMPLANT
MAT PREVALON FULL STRYKER (MISCELLANEOUS) IMPLANT
NDL HYPO 25X1 1.5 SAFETY (NEEDLE) ×1 IMPLANT
NEEDLE HYPO 25X1 1.5 SAFETY (NEEDLE) ×1 IMPLANT
NS IRRIG 1000ML POUR BTL (IV SOLUTION) IMPLANT
PACK BASIN DAY SURGERY FS (CUSTOM PROCEDURE TRAY) ×1 IMPLANT
PENCIL SMOKE EVACUATOR (MISCELLANEOUS) ×1 IMPLANT
SLEEVE SCD COMPRESS KNEE MED (STOCKING) ×1 IMPLANT
SPIKE FLUID TRANSFER (MISCELLANEOUS) ×1 IMPLANT
SPONGE T-LAP 18X18 ~~LOC~~+RFID (SPONGE) ×1 IMPLANT
STAPLER SKIN PROX WIDE 3.9 (STAPLE) IMPLANT
SUT MON AB 4-0 PC3 18 (SUTURE) ×1 IMPLANT
SUT SILK 2 0 SH (SUTURE) IMPLANT
SUT VICRYL 3-0 CR8 SH (SUTURE) ×1 IMPLANT
SYR CONTROL 10ML LL (SYRINGE) ×1 IMPLANT
TOWEL GREEN STERILE FF (TOWEL DISPOSABLE) ×1 IMPLANT
TRAY FAXITRON CT DISP (TRAY / TRAY PROCEDURE) IMPLANT
TUBE CONNECTING 20X1/4 (TUBING) ×1 IMPLANT
YANKAUER SUCT BULB TIP NO VENT (SUCTIONS) ×1 IMPLANT

## 2024-04-21 NOTE — Op Note (Signed)
 04/21/2024  8:52 AM  PATIENT:  [name]  48 y.o. female  PRE-OPERATIVE DIAGNOSIS:  LEFT BREAST BLOODY NIPPLE DISCHARGE AND SKIN TAG  POST-OPERATIVE DIAGNOSIS:  LEFT BREAST BLOODY NIPPLE DISCHARGE AND SKIN TAG  PROCEDURE:  Procedure(s) with comments: EXCISION, LESION, BREAST (Left) - LEFT BREAST SUBAREOLAR DUCT EXCISION EXCISION, SKIN TAG (Left) - EXCISION SKIN TAG LEFT NIPPLE  SURGEON:  Surgeons and Role:    * Curvin Deward MOULD, MD - Primary  PHYSICIAN ASSISTANT:   ASSISTANTS: none   ANESTHESIA:   local and general  EBL:  5 mL   BLOOD ADMINISTERED:none  DRAINS: none   LOCAL MEDICATIONS USED:  MARCAINE      SPECIMEN:  Source of Specimen:  left breast tissue and skin tag x 2  DISPOSITION OF SPECIMEN:  PATHOLOGY  COUNTS:  YES  TOURNIQUET:  * No tourniquets in log *  DICTATION: .Dragon Dictation  After informed consent was obtained the patient was brought to the operating room and placed in the supine position on the operating table.  After adequate induction of general anesthesia the patient's left breast was prepped with Betadine and draped in usual sterile manner.  An appropriate timeout was performed.  The patient had bloody discharge coming from the upper inner quadrant of the left nipple.  I was able to probe this with a small silver probe.  I elected to make a small incision along the upper inner edge of the areola with a 15 blade knife.  The incision was carried through the skin and subcutaneous tissue sharply with electrocautery.  Dissection was then carried beneath the areola and nipple from this incision.  I did encounter the Silver probe during the dissection.  Once this tissue was removed it was oriented with the appropriate paint colors.  The tissue was then sent to pathology for further evaluation.  I also removed 2 small skin tags from the surface of the nipple of the left breast sharply with Metzenbaum scissors.  These were sent separately to pathology for  evaluation.  Each skin tag area was touched with the cautery the incision along the areola was then irrigated with saline and infiltrated with more quarter percent Marcaine .  The deep layer of the incision was closed with interrupted 3-0 Vicryl stitches.  The skin was closed with interrupted 4-0 Monocryl subcuticular stitches.  Dermabond dressings were applied as well as triple antibiotic ointment and sterile dressings to the nipple.  The patient tolerated the procedure well.  At the end of the case all needle sponge and instrument counts were correct.  The patient was then awakened and taken to recovery in stable condition.  PLAN OF CARE: Discharge to home after PACU  PATIENT DISPOSITION:  PACU - hemodynamically stable.   Delay start of Pharmacological VTE agent (>24hrs) due to surgical blood loss or risk of bleeding: not applicable

## 2024-04-21 NOTE — Discharge Instructions (Addendum)
  Post Anesthesia Home Care Instructions  Activity: Get plenty of rest for the remainder of the day. A responsible individual must stay with you for 24 hours following the procedure.  For the next 24 hours, DO NOT: -Drive a car -Advertising copywriter -Drink alcoholic beverages -Take any medication unless instructed by your physician -Make any legal decisions or sign important papers.  Meals: Start with liquid foods such as gelatin or soup. Progress to regular foods as tolerated. Avoid greasy, spicy, heavy foods. If nausea and/or vomiting occur, drink only clear liquids until the nausea and/or vomiting subsides. Call your physician if vomiting continues.  Special Instructions/Symptoms: Your throat may feel dry or sore from the anesthesia or the breathing tube placed in your throat during surgery. If this causes discomfort, gargle with warm salt water. The discomfort should disappear within 24 hours.      Tylenol  can be taken at 1pm if needed

## 2024-04-21 NOTE — H&P (Signed)
 REFERRING PHYSICIAN: Loring Tanda KIDD, MD PROVIDER: DEWARD GARNETTE NULL, MD MRN: [id number] DOB: 07-16-1976 Subjective   Chief Complaint: New Consultation (Possible duct excisim due to left nipple discharge/)  History of Present Illness: [name] is a 48 y.o. female who is seen today as an office consultation for evaluation of New Consultation (Possible duct excisim due to left nipple discharge/)  We are asked to see the patient in consultation by Dr. Tanda Loring to evaluate her for left nipple bloody discharge. The patient is a 48 year old white female who has been experiencing bloody discharge from the left nipple for the last couple months. It has been occurring on a daily basis. She denies any trauma to the breast. She was evaluated with mammogram and ultrasound and MRI which have not showed any abnormalities. She does have a family history of breast cancer and a half sister and a paternal grandmother. She quit smoking about a year ago.  Review of Systems: A complete review of systems was obtained from the patient. I have reviewed this information and discussed as appropriate with the patient. See HPI as well for other ROS.  ROS   Medical History: Past Medical History:  Diagnosis Date  Anxiety  Arthritis  Asthma, unspecified asthma severity, unspecified whether complicated, unspecified whether persistent (HHS-HCC)  GERD (gastroesophageal reflux disease)  History of cancer  Sleep apnea   Patient Active Problem List  Diagnosis  Bloody discharge from left nipple   Past Surgical History:  Procedure Laterality Date  Bladder mesh removal  Bladder sling  ENDOSCOPIC CARPAL TUNNEL RELEASE  HYSTERECTOMY  SPINE SURGERY    Allergies  Allergen Reactions  Hornet Venom Anaphylaxis  Penicillins Anaphylaxis and Hives  Has patient had a PCN reaction causing immediate rash, facial/tongue/throat swelling, SOB or lightheadedness with hypotension: Yes Has patient had a PCN reaction  causing severe rash involving mucus membranes or skin necrosis: No Has patient had a PCN reaction that required hospitalization unknown - childhood reaction Has patient had a PCN reaction occurring within the last 10 years: No If all of the above answers are NO, then may proceed with Cephalosporin use.  Has patient had a PCN reaction causing immediate rash, facial/tongue/throat swelling, SOB or lightheadedness with hypotension: Yes Has patient had a PCN reaction causing severe rash involving mucus membranes or skin necrosis: No Has patient had a PCN reaction that required hospitalization No Has patient had a PCN reaction occurring within the last 10 years: Yes If all of the above answers are NO, then may proceed with Cephalosporin use.  Has patient had a PCN reaction causing immediate rash, facial/tongue/throat swelling, SOB or lightheadedness with hypotension: Yes  Has patient had a PCN reaction causing severe rash involving mucus membranes or skin necrosis: No  Has patient had a PCN reaction that required hospitalization unknown - childhood reaction  Has patient had a PCN reaction occurring within the last 10 years: No  If all of the above answers are NO, then may proceed with Cephalosporin use.  Has patient had a PCN reaction causing immediate rash, facial/tongue/throat swelling, SOB or lightheadedness with hypotension: Yes Has patient had a PCN reaction causing severe rash involving mucus membranes or skin necrosis: No Has patient had a PCN reaction that required hospitalization No Has patient had a PCN reaction occurring within the last 10 years: Yes If all of the above answers are NO, then may proceed with Cephalosporin use.  Erythromycin Hives and Other (See Comments)  Fluoxetine Hcl Hives  Prozac [Fluoxetine] Hives  Aspirin Hives and Other (See Comments)  Burning stomach   Current Outpatient Medications on File Prior to Visit  Medication Sig Dispense Refill  albuterol  MDI,  PROVENTIL , VENTOLIN , PROAIR , HFA 90 mcg/actuation inhaler Inhale 2 inhalations into the lungs every 6 (six) hours as needed for Wheezing  EPINEPHrine  (EPIPEN ) 0.3 mg/0.3 mL auto-injector Inject 0.3 mg into the muscle  montelukast (SINGULAIR) 10 mg tablet Take 10 mg by mouth once daily  oxyCODONE -acetaminophen  (PERCOCET) 5-325 mg tablet Take 1 tablet by mouth every 4 (four) hours as needed  pantoprazole  (PROTONIX ) 20 MG DR tablet 1 TAB BY MOUTH 1 TIME A DAY FOR 10 DAYS THEN AS NEEDED FOR REFLUX  rosuvastatin (CRESTOR) 20 MG tablet Take 20 mg by mouth once daily  solifenacin (VESICARE) 10 MG tablet Take 10 mg by mouth  SYMBICORT 160-4.5 mcg/actuation inhaler 2 PUFFS BY MOUTH EVERY 12 HOURS FOR ASTHMA MAINTENANCE  VEOZAH 45 mg Tab Take 1 tablet by mouth once daily  WEGOVY 1.7 mg/0.75 mL pen injector INJECT 1.7 MG UNDER SKIN EVERY WEEK.   No current facility-administered medications on file prior to visit.   Family History  Problem Relation Age of Onset  Skin cancer Mother  Stroke Father  Obesity Father  High blood pressure (Hypertension) Father  Hyperlipidemia (Elevated cholesterol) Father  Coronary Artery Disease (Blocked arteries around heart) Father  Diabetes Father    Social History   Tobacco Use  Smoking Status Former  Types: Cigarettes  Smokeless Tobacco Never    Social History   Socioeconomic History  Marital status: Unknown  Tobacco Use  Smoking status: Former  Types: Cigarettes  Smokeless tobacco: Never  Vaping Use  Vaping status: Every Day  Substance and Sexual Activity  Alcohol use: Never  Drug use: Never   Objective:   Vitals:  BP: 125/76  Pulse: 100  Temp: 36.8 C (98.3 F)  SpO2: 95%  Weight: (!) 120.1 kg (264 lb 12.8 oz)  Height: 163.8 cm (5' 4.5)  PainSc: 3  PainLoc: Breast   Body mass index is 44.75 kg/m.  Physical Exam Vitals reviewed.  Constitutional:  General: She is not in acute distress. Appearance: Normal appearance.  HENT:   Head: Normocephalic and atraumatic.  Right Ear: External ear normal.  Left Ear: External ear normal.  Nose: Nose normal.  Mouth/Throat:  Mouth: Mucous membranes are moist.  Pharynx: Oropharynx is clear.  Eyes:  General: No scleral icterus. Extraocular Movements: Extraocular movements intact.  Conjunctiva/sclera: Conjunctivae normal.  Pupils: Pupils are equal, round, and reactive to light.  Cardiovascular:  Rate and Rhythm: Normal rate and regular rhythm.  Pulses: Normal pulses.  Heart sounds: Normal heart sounds.  Pulmonary:  Effort: Pulmonary effort is normal. No respiratory distress.  Breath sounds: Normal breath sounds.  Abdominal:  General: Bowel sounds are normal.  Palpations: Abdomen is soft.  Tenderness: There is no abdominal tenderness.  Musculoskeletal:  General: No swelling, tenderness or deformity. Normal range of motion.  Cervical back: Normal range of motion and neck supple.  Skin: General: Skin is warm and dry.  Coloration: Skin is not jaundiced.  Neurological:  General: No focal deficit present.  Mental Status: She is alert and oriented to person, place, and time.  Psychiatric:  Mood and Affect: Mood normal.  Behavior: Behavior normal.     Breast: There is no palpable mass in either breast. There is no palpable axillary, supraclavicular, or cervical lymphadenopathy. There is a small amount of bloody discharge that can be expressed from the upper inner quadrant  of the left nipple with firm compression. She also has a skin tag of the left nipple  Labs, Imaging and Diagnostic Testing:  Assessment and Plan:   Diagnoses and all orders for this visit:  Bloody discharge from left nipple - [facility] Case Posting Request; Future   The patient has been experiencing bloody discharge from the left nipple with no abnormalities on radiologic workup. Because of the risk of undetected breast cancer and because of the bloody discharge she would be a good candidate for a  subareolar duct excision. She would also like to have this done. I have discussed with her in detail the risks and benefits of the operation as well as some of the technical aspects and she understands and wishes to proceed. We will also plan to remove the skin tag from the nipple at the same time. We will move forward with surgical scheduling

## 2024-04-21 NOTE — Transfer of Care (Signed)
 Immediate Anesthesia Transfer of Care Note  Patient: [name]  Procedure(s) Performed: Procedure(s) (LRB): EXCISION, LESION, BREAST (Left) EXCISION, SKIN TAG (Left)  Patient Location: PACU  Anesthesia Type: General  Level of Consciousness: awake, oriented, sedated and patient cooperative  Airway & Oxygen Therapy: Patient Spontanous Breathing and Patient connected to face mask oxygen  Post-op Assessment: Report given to PACU RN and Post -op Vital signs reviewed and stable  Post vital signs: Reviewed and stable  Complications: No apparent anesthesia complications Last Vitals:  Vitals Value Taken Time  BP    Temp    Pulse 86 04/21/24 09:01  Resp 17 04/21/24 09:01  SpO2 95 % 04/21/24 09:01  Vitals shown include unfiled device data.  Last Pain:  Vitals:   04/21/24 0703  TempSrc: Temporal  PainSc: 0-No pain      Patients Stated Pain Goal: 4 (04/21/24 0703)  Complications: No notable events documented.

## 2024-04-21 NOTE — Anesthesia Postprocedure Evaluation (Signed)
 Anesthesia Post Note  Patient: [name]  Procedure(s) Performed: EXCISION, LESION, BREAST (Left: Breast) EXCISION, SKIN TAG (Left: Breast)     Patient location during evaluation: PACU Anesthesia Type: General Level of consciousness: sedated and patient cooperative Pain management: pain level controlled Vital Signs Assessment: post-procedure vital signs reviewed and stable Respiratory status: spontaneous breathing Cardiovascular status: stable Anesthetic complications: no   No notable events documented.  Last Vitals:  Vitals:   04/21/24 0930 04/21/24 0948  BP: 137/75 125/77  Pulse: 77 75  Resp: 15 16  Temp:  36.6 C  SpO2: 90% 93%    Last Pain:  Vitals:   04/21/24 0948  TempSrc: Temporal  PainSc: 2                  Norleen Pope

## 2024-04-21 NOTE — Anesthesia Procedure Notes (Signed)
 Procedure Name: LMA Insertion Date/Time: 04/21/2024 8:16 AM  Performed by: Delayne Olam BIRCH, CRNAPre-anesthesia Checklist: Patient identified, Emergency Drugs available, Suction available and Patient being monitored Patient Re-evaluated:Patient Re-evaluated prior to induction Oxygen Delivery Method: Circle system utilized Preoxygenation: Pre-oxygenation with 100% oxygen Induction Type: IV induction Ventilation: Mask ventilation without difficulty LMA: LMA with gastric port inserted LMA Size: 4.0 Number of attempts: 1 Airway Equipment and Method: Bite block Placement Confirmation: positive ETCO2 Tube secured with: Tape Dental Injury: Teeth and Oropharynx as per pre-operative assessment

## 2024-04-21 NOTE — Interval H&P Note (Signed)
 History and Physical Interval Note:  04/21/2024 7:48 AM  [name]  has presented today for surgery, with the diagnosis of LEFT BREAST BLOODY NIPPLE DISCHARGE AND SKIN TAG.  The various methods of treatment have been discussed with the patient and family. After consideration of risks, benefits and other options for treatment, the patient has consented to  Procedure(s) with comments: EXCISION, LESION, BREAST (Left) - LEFT BREAST SUBAREOLAR DUCT EXCISION EXCISION, SKIN TAG (Left) - EXCISION SKIN TAG LEFT NIPPLE as a surgical intervention.  The patient's history has been reviewed, patient examined, no change in status, stable for surgery.  I have reviewed the patient's chart and labs.  Questions were answered to the patient's satisfaction.     Deward Null III

## 2024-04-22 ENCOUNTER — Encounter (HOSPITAL_BASED_OUTPATIENT_CLINIC_OR_DEPARTMENT_OTHER): Payer: Self-pay | Admitting: General Surgery

## 2024-04-23 LAB — SURGICAL PATHOLOGY

## 2024-04-28 ENCOUNTER — Ambulatory Visit: Payer: Self-pay | Admitting: General Surgery

## 2024-09-25 ENCOUNTER — Other Ambulatory Visit: Payer: Self-pay | Admitting: General Surgery

## 2024-09-25 DIAGNOSIS — N6452 Nipple discharge: Secondary | ICD-10-CM

## 2024-10-14 ENCOUNTER — Other Ambulatory Visit

## 2024-10-14 ENCOUNTER — Encounter

## 2024-10-21 ENCOUNTER — Ambulatory Visit: Admitting: Neurology

## 2024-10-27 ENCOUNTER — Ambulatory Visit: Admitting: Diagnostic Neuroimaging
